# Patient Record
Sex: Male | Born: 1945 | Race: Black or African American | Hispanic: No | Marital: Married | Smoking: Never smoker
Health system: Southern US, Community
[De-identification: ages and names within clinical notes are randomized; demographics above are authoritative.]

## PROBLEM LIST (undated history)

## (undated) DIAGNOSIS — E119 Type 2 diabetes mellitus without complications: Secondary | ICD-10-CM

## (undated) DIAGNOSIS — I10 Essential (primary) hypertension: Secondary | ICD-10-CM

---

## 2007-11-05 ENCOUNTER — Ambulatory Visit: Payer: Self-pay | Admitting: Gastroenterology

## 2012-04-18 ENCOUNTER — Encounter: Payer: Self-pay | Admitting: Internal Medicine

## 2012-04-29 ENCOUNTER — Encounter: Payer: Self-pay | Admitting: Internal Medicine

## 2012-12-24 DIAGNOSIS — E113599 Type 2 diabetes mellitus with proliferative diabetic retinopathy without macular edema, unspecified eye: Secondary | ICD-10-CM | POA: Insufficient documentation

## 2012-12-24 DIAGNOSIS — E113513 Type 2 diabetes mellitus with proliferative diabetic retinopathy with macular edema, bilateral: Secondary | ICD-10-CM | POA: Insufficient documentation

## 2013-01-07 DIAGNOSIS — I10 Essential (primary) hypertension: Secondary | ICD-10-CM | POA: Insufficient documentation

## 2013-01-07 DIAGNOSIS — E785 Hyperlipidemia, unspecified: Secondary | ICD-10-CM | POA: Insufficient documentation

## 2013-01-07 DIAGNOSIS — Z8673 Personal history of transient ischemic attack (TIA), and cerebral infarction without residual deficits: Secondary | ICD-10-CM | POA: Insufficient documentation

## 2014-02-12 ENCOUNTER — Ambulatory Visit: Payer: Self-pay | Admitting: Internal Medicine

## 2016-08-28 DIAGNOSIS — E113513 Type 2 diabetes mellitus with proliferative diabetic retinopathy with macular edema, bilateral: Secondary | ICD-10-CM | POA: Insufficient documentation

## 2018-12-12 ENCOUNTER — Ambulatory Visit (INDEPENDENT_AMBULATORY_CARE_PROVIDER_SITE_OTHER): Payer: Medicare Other

## 2018-12-12 ENCOUNTER — Ambulatory Visit (INDEPENDENT_AMBULATORY_CARE_PROVIDER_SITE_OTHER): Payer: Medicare Other | Admitting: Podiatry

## 2018-12-12 ENCOUNTER — Encounter: Payer: Self-pay | Admitting: Podiatry

## 2018-12-12 VITALS — BP 171/77 | HR 83

## 2018-12-12 DIAGNOSIS — L97519 Non-pressure chronic ulcer of other part of right foot with unspecified severity: Secondary | ICD-10-CM

## 2018-12-12 DIAGNOSIS — E11621 Type 2 diabetes mellitus with foot ulcer: Secondary | ICD-10-CM | POA: Diagnosis not present

## 2018-12-12 DIAGNOSIS — L03031 Cellulitis of right toe: Secondary | ICD-10-CM

## 2018-12-12 DIAGNOSIS — L02611 Cutaneous abscess of right foot: Secondary | ICD-10-CM | POA: Diagnosis not present

## 2018-12-12 NOTE — Patient Instructions (Signed)

## 2018-12-12 NOTE — Progress Notes (Signed)
This patient presents the office stating that he has developed an ulcer on the inside of his fifth toe right foot.  He states he has been dealing with this problem for over 4 weeks.  He says he was applying baby powder and  tinactin to the inside of his fifth toe right foot initially.  He says that this skin problem worsened and he was seen by Dr. Arlana Pouch from [facility].  Dr. Arlana Pouch told him to start soaks and gave him cephalexin tablets for the infection.  He also then referred him to this office for an evaluation and treatment.  He presents the office stating that he has intermittent pain of the fifth toe.  He presents the office today wearing dress shoes.  He presents the office today with his wife for an evaluation and treatment of this fifth toe.  This patient is diabetic and his last A1c was 8.0.  He presents the office today for treatment of his infected diabetic ulcer fifth toe right foot. He says his ulcer has improved since nowhe was seen last week by Dr.  Arlana Pouch.  It usually never comes probably too difficult to get up from [facility] so early  General Appearance  Alert, conversant and in no acute stress.  Vascular  Dorsalis pedis and posterior tibial  pulses are palpable  bilaterally.  Capillary return is within normal limits  bilaterally. Temperature is within normal limits  bilaterally.  Neurologic  Senn-Weinstein monofilament wire test within normal limits  bilaterally. Muscle power within normal limits bilaterally.  Nails Thick disfigured discolored nails with subungual debris  from hallux to fifth toes bilaterally. No evidence of bacterial infection or drainage bilaterally.  Orthopedic  No limitations of motion  feet .  No crepitus or effusions noted.  No bony pathology or digital deformities noted.  Severe adductovarus fifth digit right foot.  Skin total breakdown of of the skin on the medial aspect of the fifth toe extending from the DIPJ to the midshaft of the proximal phalanx fifth toe.Marland Kitchen Swelling  is noted on the fifth toe but no redness or pus noted in the ulcerated site.    Diabetic infected ulcer fifth toe right foot.  IE.  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  There appears to be no evidence of any bony infection of the fifth toe of the right foot.  Discussed this condition with this patient.  Told this patient he needs to perform home soaks and description of the soaks was given to him with the AVS. he was also told to continue taking his antibiotics that he received from Dr. Arlana Pouch.  I also instructed him and his wife on how to bandage the ulcer of the fifth toe right foot.  He is to return to the office in 1 week for further evaluation and treatment.   Helane Gunther DPM

## 2018-12-16 ENCOUNTER — Ambulatory Visit (INDEPENDENT_AMBULATORY_CARE_PROVIDER_SITE_OTHER): Payer: Medicare Other | Admitting: Podiatry

## 2018-12-16 ENCOUNTER — Encounter: Payer: Self-pay | Admitting: Podiatry

## 2018-12-16 DIAGNOSIS — L97519 Non-pressure chronic ulcer of other part of right foot with unspecified severity: Secondary | ICD-10-CM

## 2018-12-16 DIAGNOSIS — L03031 Cellulitis of right toe: Secondary | ICD-10-CM | POA: Diagnosis not present

## 2018-12-16 DIAGNOSIS — E11621 Type 2 diabetes mellitus with foot ulcer: Secondary | ICD-10-CM | POA: Diagnosis not present

## 2018-12-16 DIAGNOSIS — L02611 Cutaneous abscess of right foot: Secondary | ICD-10-CM

## 2018-12-16 MED ORDER — CEPHALEXIN 500 MG PO CAPS: 500.0000 mg | ORAL_CAPSULE | Freq: Two times a day (BID) | ORAL | 0 refills | Status: DC

## 2018-12-16 NOTE — Progress Notes (Signed)
This patient returns to the office for continued evaluation of his fifth toe right foot.  He presents the office today with his wife for follow-up exam.  He was initially seen by Dr. Arlana Pouch from [facility] for approximately 4 weeks and Dr. Jae Dire referred him for an office visit on February 13.  He was diagnosed with a diabetic ulcer and cellulitis fifth toe right foot per Dr. Arlana Pouch.  He presents the office today stating that he does not like the looks of the fifth toe today.  He states he is having less pain and discomfort to his toe and is wearing his surgical shoe.  He has been soaking his toe and rebandaging his toe. He is concerned about the white skin covering over fifth toe right foot. He presents for evaluation and to make sure the toe is still improving.Edwin Wagner  No chills or fever noted.  General Appearance  Alert, conversant and in no acute stress.  Vascular  Dorsalis pedis and posterior tibial  pulses are palpable  bilaterally.  Capillary return is within normal limits  bilaterally. Temperature is within normal limits  bilaterally.  Neurologic  Senn-Weinstein monofilament wire test within normal limits  bilaterally. Muscle power within normal limits bilaterally.  Nails Thick disfigured discolored nails with subungual debris  from hallux to fifth toes bilaterally. No evidence of bacterial infection or drainage bilaterally.  Orthopedic  No limitations of motion  feet .  No crepitus or effusions noted.  No bony pathology or digital deformities noted. Adductovarus fifth toe right foot.  Skin  There is complete desquamation noted on the lateral aspect of fifth toe right foot.  The subcutaneous tissue is filling in medially in fifth toe.  .  No evidence of pus or drainage.  Healthy granulation is noted along  fifth toe right foot.  There is mild swelling noted over the metatarsals of the right foot but no increased temperature or streaking noted from the fifth toe.    Diabetic ulcer.  ROV.  Examination of  the fifth toe reveals all the skin has com completely  desquamated.   Healing subcutaneous tissue noted to the fifth toe right foot.  I chose to leave the desquamated skin as a protective cover of the fifth toe and re-bandaged the toe.  I re-bandaged the toe by buddy taping the fourth and fifth toes together.  As this was being done the wife volunteered that she has been doing the bandaging incorrectly.  This made me consider that she may have caused the complete desquamation as she was bandaging the fifth toe.  I believe the fifth toe is healing despite the desquamation of the skin fifth toe right foot.  I let the desquamated skin  persist until Thursday morning when this skin will be resected. I will use the desquamated skin as a biological cover.   Continue home soaks and bandaging.  Prescribe cephalexin tabs.  RTC 3 days   Helane Gunther DPM

## 2018-12-19 ENCOUNTER — Ambulatory Visit: Payer: Medicare Other | Admitting: Podiatry

## 2018-12-19 ENCOUNTER — Encounter: Payer: Self-pay | Admitting: Podiatry

## 2018-12-19 ENCOUNTER — Ambulatory Visit (INDEPENDENT_AMBULATORY_CARE_PROVIDER_SITE_OTHER): Payer: Medicare Other | Admitting: Podiatry

## 2018-12-19 DIAGNOSIS — L02611 Cutaneous abscess of right foot: Secondary | ICD-10-CM

## 2018-12-19 DIAGNOSIS — E11621 Type 2 diabetes mellitus with foot ulcer: Secondary | ICD-10-CM | POA: Diagnosis not present

## 2018-12-19 DIAGNOSIS — L97519 Non-pressure chronic ulcer of other part of right foot with unspecified severity: Secondary | ICD-10-CM | POA: Diagnosis not present

## 2018-12-19 DIAGNOSIS — L03031 Cellulitis of right toe: Secondary | ICD-10-CM

## 2018-12-19 NOTE — Progress Notes (Signed)
This patient returns to the office for continued evaluation of his fifth toe right foot.   He presented to the office on Monday and it was noted that there was complete desquamation noted on the skin of the fifth toe right foot.  It exposed the subcutaneous tissue under the skin.  Patient was sent home with instructions to soak his fifth toe and bandage his fifth toe with Neosporin.  He presents the office today and the desquamated skin has demarcated to the base of the fifth toe right foot.  He states that his toe is doing better but he did see bleeding from the toe yesterday.  He presents the office today for continued evaluation of this fifth toe.  General Appearance  Alert, conversant and in no acute stress.  Vascular  Dorsalis pedis and posterior tibial  pulses are palpable  bilaterally.  Capillary return is within normal limits  bilaterally. Temperature is within normal limits  bilaterally except the fifth toe right is cooler compared to his other toes.  Neurologic  Senn-Weinstein monofilament wire test within normal limits  bilaterally. Muscle power within normal limits bilaterally.  Nails Thick disfigured discolored nails with subungual debris  from hallux to fifth toes bilaterally. No evidence of bacterial infection or drainage bilaterally.  Orthopedic  No limitations of motion  feet .  No crepitus or effusions noted. Adductovarus fifth digit right foot.    Skin  normotropic skin with no porokeratosis noted bilaterally with the exception  of his fifth toe right foot.  There is peeling skin at the site of the skin cover fifth toe right foot.  There is still red granulation tissue noted medially laterally and plantarly fifth toe.  There does appear to be a black hematoma noted on the dorsum which may have caused yesterday's bleeding event.  Patient was bathed with Betadine solution and a dry sterile dressing.  He was told he continues to paint his fifth toe and bandages as directed.  He is to  continue his antibiotics.  He is to return the office in 1 week for further evaluation and treatment.  Patient was told that if the toe toe increases its something for toe basis splint to splint holding office black  Discoloration  to the officethe call the office. If pain worsens he is also to call the office to be seen.     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  Today  his  fifth toe is experiencing  a vascular insult  and is no longer infected.  Perhaps he experienced a vascular insult initially which was treated as an infection.  If he had an infection this may have caused a vascular insult thus causing the desquamation to occur.  I did discuss this case with Dr.  Ardelle Anton and we decided to paint his toe with betadine and allow his toe to declare itself.Helane Gunther DPM

## 2018-12-26 ENCOUNTER — Ambulatory Visit (INDEPENDENT_AMBULATORY_CARE_PROVIDER_SITE_OTHER): Payer: Medicare Other | Admitting: Podiatry

## 2018-12-26 ENCOUNTER — Encounter: Payer: Self-pay | Admitting: Podiatry

## 2018-12-26 DIAGNOSIS — L97519 Non-pressure chronic ulcer of other part of right foot with unspecified severity: Secondary | ICD-10-CM | POA: Diagnosis not present

## 2018-12-26 DIAGNOSIS — L03031 Cellulitis of right toe: Secondary | ICD-10-CM

## 2018-12-26 DIAGNOSIS — L02611 Cutaneous abscess of right foot: Secondary | ICD-10-CM

## 2018-12-26 DIAGNOSIS — E11621 Type 2 diabetes mellitus with foot ulcer: Secondary | ICD-10-CM

## 2018-12-26 MED ORDER — DOXYCYCLINE HYCLATE 100 MG PO TABS: 100.0000 mg | ORAL_TABLET | Freq: Two times a day (BID) | ORAL | 0 refills | Status: DC

## 2018-12-26 NOTE — Progress Notes (Signed)
This patient returns to the office for continued evaluation and treatment of his fifth toe right foot.  He was initially seen and described having an infection to the outside of his right foot with a small diabetic ulcer on the inside of his fifth toe.  Since that initial visit he ended up having desquamation noted of all the skin on the distal and lateral aspect of the fifth toe .  He did have black hematoma noted on the dorsum of the fifth toe.  I instructed him to paint the fifth toe with Betadine after soaking his foot and then bandaging his toe.  He presents the office today with increased black discoloration on the dorsum of the fifth toe but the red granulation has dried.  He says he has been very active the last 2 days and his foot has become red hot and swollen on the outside top of his right foot.  He presents the office today for continued evaluation and treatment of his diabetic fifth toe.  This fifth toe is acting as if there was a vascular insult  General Appearance  Alert, conversant and in no acute stress.  Vascular  Dorsalis pedis and posterior tibial  pulses are palpable  bilaterally.  Capillary return is within normal limits  bilaterally. Temperature is within normal limits  bilaterally.  Neurologic  Senn-Weinstein monofilament wire test within normal limits  bilaterally. Muscle power within normal limits bilaterally.  Nails thick disfigured discolored nails with small debris noted hallux nails to fifth toenails bilaterally.  No evidence of bacterial infection or drainage bilaterally.  Orthopedic  No limitations of motion  feet .  No crepitus or effusions noted.  No bony pathology or digital deformities noted.  Skin normal atrophic skin noted with no porokeratosis with the exception of the fifth toe right foot.  There is black covering noted on the dorsum of the fifth toe right foot.  There is granulation tissue noted medially laterally and plantarly of the fifth toe.  There is  increased redness swelling in addition to increased temperature in his right foot.    Diabetic ulcer  Right foot  Cellulitis right foot.  Diabetic Ulcer fifth toe right foot.  Cellulitis right foot.  ROV.  Debridement of the black necrotic tissue was performed on the dorsum of the fifth toe right foot.  The toe was re-bandaged with Neosporin and a dry sterile dressing.  Discussed with the patient and his wife the fact that I believe he has had a vascular insult to the fifth toe and hit his fifth toe needs good circulation in order to completely heal.  In the meantime I think he is developed a cellulitis over the fourth and fifth met heads and shafts right foot.  Patient was told to continue soaks and Betadine painting of the fifth toe.  He was told to return to the office tomorrow for an evaluation by Dr. Amalia Hailey.  To be prescribed doxycycline to take by mouth.  Told patient that Dr. Amalia Hailey will re-schedule him or send him back to me next week.   Gardiner Barefoot DPM

## 2018-12-27 ENCOUNTER — Ambulatory Visit (INDEPENDENT_AMBULATORY_CARE_PROVIDER_SITE_OTHER): Payer: Medicare Other | Admitting: Podiatry

## 2018-12-27 ENCOUNTER — Encounter: Payer: Self-pay | Admitting: Podiatry

## 2018-12-27 DIAGNOSIS — E0843 Diabetes mellitus due to underlying condition with diabetic autonomic (poly)neuropathy: Secondary | ICD-10-CM | POA: Diagnosis not present

## 2018-12-27 DIAGNOSIS — I96 Gangrene, not elsewhere classified: Secondary | ICD-10-CM

## 2018-12-27 DIAGNOSIS — I739 Peripheral vascular disease, unspecified: Secondary | ICD-10-CM | POA: Diagnosis not present

## 2018-12-27 NOTE — Patient Instructions (Signed)
Pre-Operative Instructions  Congratulations, you have decided to take an important step towards improving your quality of life.  You can be assured that the doctors and staff at [facility] will be with you every step of the way.  Here are some important things you should know:  1. Plan to be at the surgery center/hospital at least 1 (one) hour prior to your scheduled time, unless otherwise directed by the surgical center/hospital staff.  You must have a responsible adult accompany you, remain during the surgery and drive you home.  Make sure you have directions to the surgical center/hospital to ensure you arrive on time. 2. If you are having surgery at [facility] or [facility], you will need a copy of your medical history and physical form from your family physician within one month prior to the date of surgery. We will give you a form for your primary physician to complete.  3. We make every effort to accommodate the date you request for surgery.  However, there are times where surgery dates or times have to be moved.  We will contact you as soon as possible if a change in schedule is required.   4. No aspirin/ibuprofen for one week before surgery.  If you are on aspirin, any non-steroidal anti-inflammatory medications (Mobic, Aleve, Ibuprofen) should not be taken seven (7) days prior to your surgery.  You make take Tylenol for pain prior to surgery.  5. Medications - If you are taking daily heart and blood pressure medications, seizure, reflux, allergy, asthma, anxiety, pain or diabetes medications, make sure you notify the surgery center/hospital before the day of surgery so they can tell you which medications you should take or avoid the day of surgery. 6. No food or drink after midnight the night before surgery unless directed otherwise by surgical center/hospital staff. 7. No alcoholic beverages 24-hours prior to surgery.  No smoking 24-hours prior or 24-hours after  surgery. 8. Wear loose pants or shorts. They should be loose enough to fit over bandages, boots, and casts. 9. Don't wear slip-on shoes. Sneakers are preferred. 10. Bring your boot with you to the surgery center/hospital.  Also bring crutches or a walker if your physician has prescribed it for you.  If you do not have this equipment, it will be provided for you after surgery. 11. If you have not been contacted by the surgery center/hospital by the day before your surgery, call to confirm the date and time of your surgery. 12. Leave-time from work may vary depending on the type of surgery you have.  Appropriate arrangements should be made prior to surgery with your employer. 13. Prescriptions will be provided immediately following surgery by your doctor.  Fill these as soon as possible after surgery and take the medication as directed. Pain medications will not be refilled on weekends and must be approved by the doctor. 14. Remove nail polish on the operative foot and avoid getting pedicures prior to surgery. 15. Wash the night before surgery.  The night before surgery wash the foot and leg well with water and the antibacterial soap provided. Be sure to pay special attention to beneath the toenails and in between the toes.  Wash for at least three (3) minutes. Rinse thoroughly with water and dry well with a towel.  Perform this wash unless told not to do so by your physician.  Enclosed: 1 Ice pack (please put in freezer the night before surgery)   1 Hibiclens skin cleaner     Pre-op instructions  If you have any questions regarding the instructions, please do not hesitate to call our office.  [place]: [address], [place], [place] [address] -- [phone]  [place]: [address] [place], [place] [address] -- [phone]  [place]: [address]  [place], [place] [address] -- [phone]  [place]: [address], [address], [place], [place] [address] -- [phone]  Website:  [email or web address] 

## 2018-12-30 ENCOUNTER — Telehealth: Payer: Self-pay | Admitting: *Deleted

## 2018-12-30 NOTE — Telephone Encounter (Signed)
"  I'm calling to schedule my husband's surgery.  He saw Dr. Logan Bores on Friday and he told us to call you to schedule the surgery."  Dr. Logan Bores can do a lunch time case on Wednesday at 12:30 pm.  He will need to arrive at 11:30 am.  "I have a hair appointment that I need to go to that date.  It's hard to reschedule because they use that phone service.  Can he do it next week?"  Dr. Logan Bores wrote on his paperwork as soon as possible.  "Oh,okay I didn't realize that.  I thought he said in the next three weeks.  Go ahead and schedule him for then.  I'll reschedule my appointment."

## 2019-01-01 ENCOUNTER — Other Ambulatory Visit: Payer: Self-pay | Admitting: Podiatry

## 2019-01-01 DIAGNOSIS — L97519 Non-pressure chronic ulcer of other part of right foot with unspecified severity: Secondary | ICD-10-CM

## 2019-01-01 DIAGNOSIS — E11621 Type 2 diabetes mellitus with foot ulcer: Secondary | ICD-10-CM

## 2019-01-01 DIAGNOSIS — M86671 Other chronic osteomyelitis, right ankle and foot: Secondary | ICD-10-CM | POA: Diagnosis not present

## 2019-01-01 DIAGNOSIS — L03031 Cellulitis of right toe: Secondary | ICD-10-CM | POA: Diagnosis not present

## 2019-01-01 DIAGNOSIS — L02611 Cutaneous abscess of right foot: Secondary | ICD-10-CM | POA: Diagnosis not present

## 2019-01-01 MED ORDER — CIPROFLOXACIN HCL 500 MG PO TABS: 500.0000 mg | ORAL_TABLET | Freq: Two times a day (BID) | ORAL | 0 refills | Status: DC

## 2019-01-01 MED ORDER — SULFAMETHOXAZOLE-TRIMETHOPRIM 800-160 MG PO TABS: 1.0000 | ORAL_TABLET | Freq: Two times a day (BID) | ORAL | 0 refills | Status: DC

## 2019-01-01 MED ORDER — HYDROCODONE-ACETAMINOPHEN 5-325 MG PO TABS: 1.0000 | ORAL_TABLET | Freq: Four times a day (QID) | ORAL | 0 refills | Status: DC | PRN

## 2019-01-01 NOTE — Progress Notes (Unsigned)
.  postop

## 2019-01-02 ENCOUNTER — Telehealth: Payer: Self-pay

## 2019-01-02 ENCOUNTER — Other Ambulatory Visit: Payer: Self-pay

## 2019-01-02 ENCOUNTER — Telehealth: Payer: Self-pay | Admitting: *Deleted

## 2019-01-02 DIAGNOSIS — I96 Gangrene, not elsewhere classified: Secondary | ICD-10-CM

## 2019-01-02 DIAGNOSIS — I739 Peripheral vascular disease, unspecified: Secondary | ICD-10-CM

## 2019-01-02 NOTE — Telephone Encounter (Signed)
-----   Message from Felecia Shelling, DPM sent at 01/01/2019  5:51 PM EST ----- Regarding: arterial doppler Please order arterial doppler RLE.   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   Thanks Dr. Logan Bores

## 2019-01-02 NOTE — Telephone Encounter (Signed)
Referral has been entered in chart °

## 2019-01-02 NOTE — Addendum Note (Signed)
Addended by: Geraldine Contras D on: 01/02/2019 02:37 PM   Modules accepted: Orders

## 2019-01-02 NOTE — Telephone Encounter (Signed)
-----   Message from Felecia Shelling, DPM sent at 12/27/2018  1:03 PM EST ----- Regarding: arterial doppler RLE Please order arterial doppler RLE.   Dx: gangrene RT 5th toe. PVD  Thanks, Dr. Logan Bores

## 2019-01-04 NOTE — Progress Notes (Signed)
   HPI: 73 year old male presents the office today as a referral from Dr. Stacie Acres within the practice for evaluation of gangrenous toe to the right foot.  Conservative treatment has been the mainstay of until now.  The toe is now necrotic with a slight odor.  Patient was started on doxycycline yesterday, 12/26/2018 by Dr. Stacie Acres.  Patient has been taking Advil to alleviate pain.  Patient was referred to me for possible surgical consult regarding amputation and incision and drainage of the fifth toe right foot.  No past medical history on file.   Physical Exam: General: The patient is alert and oriented x3 in no acute distress.  Dermatology: Skin is warm, dry and supple bilateral lower extremities. Negative for open lesions or macerations with exception of wet gangrene encompassing the entire fifth digit of the right foot.  Moderate malodor noted.  There is some proximal erythema with edema noted to the dorsal lateral aspect of the foot.  Erythema does not extend proximal to the ankle.  Vascular: Diminished pedal pulses bilaterally.  Skin is warm to touch.  Neurological: Epicritic and protective threshold finished bilaterally.   Musculoskeletal Exam: Range of motion within normal limits to all pedal and ankle joints bilateral. Muscle strength 5/5 in all groups bilateral.   Assessment: 1.  Gangrenous fifth toe right foot 2.  Abscess with cellulitis right foot 3.  Diabetes mellitus   Plan of Care:  1. Patient evaluated.  2.  Today I discussed with the patient that he needs surgical amputation of fifth digit right foot.  Patient has active acute infection and I explained that if there is a not addressed promptly the infection could spread proximal become life-threatening.  All possible complications and details the procedure were explained.  No guarantees were expressed or implied. 3.  Authorization for surgery initiated today.  Surgery will consist of amputation fifth digit right foot with incision  and drainage 4.  Today we will place an order for arterial Doppler right lower extremity 5.  Recommend daily application of Betadine solution and dry sterile dressing to the wound and the fifth toe 6.  Continue doxycycline 100 mg BID as prescribed 7.  Return to clinic 1 week postop      Felecia Shelling, DPM [facility]  Dr. Felecia Shelling, DPM    [address] [place], [place] [address]                Office [phone]  Fax [phone]

## 2019-01-06 ENCOUNTER — Telehealth: Payer: Self-pay

## 2019-01-06 ENCOUNTER — Encounter: Payer: Self-pay | Admitting: Podiatry

## 2019-01-06 NOTE — Telephone Encounter (Signed)
Called pt post-surgery; got VM; informed pt call was to check on him; instructed pt to call office with any questions or concerns before next appt, if any.

## 2019-01-07 ENCOUNTER — Ambulatory Visit (INDEPENDENT_AMBULATORY_CARE_PROVIDER_SITE_OTHER): Payer: Medicare Other

## 2019-01-07 ENCOUNTER — Ambulatory Visit (INDEPENDENT_AMBULATORY_CARE_PROVIDER_SITE_OTHER): Payer: Medicare Other | Admitting: Podiatry

## 2019-01-07 VITALS — BP 166/71 | HR 80 | Temp 98.1°F

## 2019-01-07 DIAGNOSIS — Z89421 Acquired absence of other right toe(s): Secondary | ICD-10-CM | POA: Diagnosis not present

## 2019-01-08 ENCOUNTER — Other Ambulatory Visit: Payer: Medicare Other

## 2019-01-10 NOTE — Progress Notes (Signed)
   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 DOS: 01/01/2019. He states he is doing well. He has been taking the pain medication as directed which has made the pain tolerable. There are no worsening factors noted. Patient is here for further evaluation and treatment.    No past medical history on file.    Objective/Physical Exam Neurovascular status intact.  Skin incisions appear to be well coapted with sutures and staples intact. No sign of infectious process noted. No dehiscence. No active bleeding noted. Moderate edema noted to the surgical extremity.  Radiographic Exam:  Osteotomies sites appear to be stable with routine healing.  Assessment: 1. 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 DOS: 01/01/2019   Plan of Care:  1. Patient was evaluated. X-rays reviewed 2. Dressing changed. Keep clean, dry and intact for one week.  3. Discontinue using CAM boot.  4. Post op shoe dispensed. Weightbearing as tolerated.  5. Return to clinic in one week.    Felecia Shelling, DPM [facility]  Dr. Felecia Shelling, DPM    [address]                                        [place], [place] [address]                Office [phone]  Fax [phone]

## 2019-01-14 ENCOUNTER — Ambulatory Visit (INDEPENDENT_AMBULATORY_CARE_PROVIDER_SITE_OTHER): Payer: Self-pay | Admitting: Podiatry

## 2019-01-14 ENCOUNTER — Other Ambulatory Visit: Payer: Self-pay

## 2019-01-14 DIAGNOSIS — L02611 Cutaneous abscess of right foot: Secondary | ICD-10-CM

## 2019-01-14 DIAGNOSIS — I96 Gangrene, not elsewhere classified: Secondary | ICD-10-CM

## 2019-01-14 DIAGNOSIS — Z89421 Acquired absence of other right toe(s): Secondary | ICD-10-CM

## 2019-01-14 DIAGNOSIS — I739 Peripheral vascular disease, unspecified: Secondary | ICD-10-CM

## 2019-01-14 NOTE — Addendum Note (Signed)
Addended by: Geraldine Contras D on: 01/14/2019 11:26 AM   Modules accepted: Orders

## 2019-01-14 NOTE — Progress Notes (Signed)
   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 DOS: 01/01/2019. He states he is doing well.  Patient states the pain is improved significantly.  He has been keeping the dressings clean dry and intact and weightbearing in postoperative shoe.  No past medical history on file.    Objective/Physical Exam Neurovascular status intact.  Skin incisions appear to be well coapted with sutures and staples intact with exception of a small area of dehiscence proximally at the area where the packing was applied postoperatively.  Dehiscence measures approximately 0.7 cm in length.  There is some serous drainage noted as well. No active bleeding noted. Moderate edema noted to the surgical extremity.  Assessment: 1. 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 DOS: 01/01/2019   Plan of Care:  1. Patient was evaluated. 2. Dressing changed. Keep clean, dry and intact for one week.  3.  Continue postoperative shoe 4.  Today new wound cultures were taken of the serous drainage of the proximal dehiscence site 5.  Patient just recently finished oral Bactrim DS antibiotics.  Antibiotics from the surgery center taking postoperatively are currently not available.  We are waiting to have the surgery center fax the results over to see if he needs additional or different oral antibiotics 6.  Return to clinic in 1 week  Felecia Shelling, DPM [facility]  Dr. Felecia Shelling, DPM    [address]                                        [place], [place] [address]                Office [phone]  Fax [phone]

## 2019-01-17 LAB — WOUND CULTURE: Organism ID, Bacteria: NONE SEEN

## 2019-01-21 ENCOUNTER — Encounter: Payer: Self-pay | Admitting: Podiatry

## 2019-01-21 ENCOUNTER — Ambulatory Visit (INDEPENDENT_AMBULATORY_CARE_PROVIDER_SITE_OTHER): Payer: Medicare Other

## 2019-01-21 ENCOUNTER — Ambulatory Visit (INDEPENDENT_AMBULATORY_CARE_PROVIDER_SITE_OTHER): Payer: Medicare Other | Admitting: Podiatry

## 2019-01-21 ENCOUNTER — Other Ambulatory Visit: Payer: Self-pay

## 2019-01-21 DIAGNOSIS — Z89421 Acquired absence of other right toe(s): Secondary | ICD-10-CM

## 2019-01-21 DIAGNOSIS — L02611 Cutaneous abscess of right foot: Secondary | ICD-10-CM

## 2019-01-22 NOTE — Progress Notes (Signed)
   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 DOS: 01/01/2019. He states he is doing well. He denies any pain or modifying factors. He has been using the post op shoe as directed. Patient is here for further evaluation and treatment.   History reviewed. No pertinent past medical history.    Objective/Physical Exam Neurovascular status intact.  Skin incisions appear to be well coapted with sutures and staples intact with exception of a small area of dehiscence proximally at the area where the packing was applied postoperatively.  Dehiscence measures approximately 0.7 cm in length.  There is some serous drainage noted as well. No active bleeding noted. Moderate edema noted to the surgical extremity.  Radiographic Exam:  Osteotomies sites appear to be stable with routine healing.  Assessment: 1. 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 DOS: 01/01/2019   Plan of Care:  1. Patient was evaluated. X-Rays reviewed.  2. Dressing changed. Keep clean, dry and intact for one week.  3. Continue using post op shoe. 4. Recommended using Betadine daily with a dry sterile dressing. Supplies provided.  5. Return to clinic in 2 weeks.   Felecia Shelling, DPM [facility]  Dr. Felecia Shelling, DPM    [address]                                        [place], [place] [address]                Office [phone]  Fax [phone]

## 2019-02-04 ENCOUNTER — Ambulatory Visit (INDEPENDENT_AMBULATORY_CARE_PROVIDER_SITE_OTHER): Payer: Medicare Other | Admitting: Podiatry

## 2019-02-04 ENCOUNTER — Encounter: Payer: Self-pay | Admitting: Podiatry

## 2019-02-04 ENCOUNTER — Other Ambulatory Visit: Payer: Self-pay

## 2019-02-04 ENCOUNTER — Ambulatory Visit (INDEPENDENT_AMBULATORY_CARE_PROVIDER_SITE_OTHER): Payer: Medicare Other

## 2019-02-04 VITALS — Temp 97.7°F

## 2019-02-04 DIAGNOSIS — Z89421 Acquired absence of other right toe(s): Secondary | ICD-10-CM

## 2019-02-04 DIAGNOSIS — L02611 Cutaneous abscess of right foot: Secondary | ICD-10-CM

## 2019-02-04 MED ORDER — SULFAMETHOXAZOLE-TRIMETHOPRIM 800-160 MG PO TABS: 1.0000 | ORAL_TABLET | Freq: Two times a day (BID) | ORAL | 0 refills | Status: DC

## 2019-02-05 ENCOUNTER — Other Ambulatory Visit: Payer: Self-pay | Admitting: Podiatry

## 2019-02-05 DIAGNOSIS — I96 Gangrene, not elsewhere classified: Secondary | ICD-10-CM

## 2019-02-07 LAB — WOUND CULTURE

## 2019-02-10 NOTE — Progress Notes (Signed)
   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 DOS: 01/01/2019.  Patient states that the wound is slowly healing.  He believes it is doing okay.  He has been weightbearing in postoperative shoe no new complaints at this time   No past medical history on file.    Objective/Physical Exam Neurovascular status intact.  Skin incisions appear to be well coapted with sutures and staples intact with exception of a small area of dehiscence proximally as well as a small area distally.  Dehiscence measures approximately 0.7 cm in length.  There is some serous drainage noted as well.  With pressure applied to the amputation site there is some purulent drainage that was noted.  No active bleeding noted.  Negative for edema or erythema to the surgical extremity.  Radiographic Exam:  Osteotomies sites appear to be stable with routine healing.  Assessment: 1. 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 DOS: 01/01/2019 2.  Abscess at the amputation stump right foot   Plan of Care:  1. Patient was evaluated. X-Rays reviewed.  2.  Purulent drainage was expressed from the areas of dehiscence and cultures were taken and sent to pathology 3.  Sutures and staples were removed today 4.  Wound was cleaned and dry sterile dressing was applied 5.  Prescription for Bactrim DS #20 6.  Return to clinic in 2 weeks  Felecia Shelling, DPM [facility]  Dr. Felecia Shelling, DPM    [address]                                        [place], [place] [address]                Office [phone]  Fax [phone]

## 2019-02-18 ENCOUNTER — Encounter: Payer: Self-pay | Admitting: Podiatry

## 2019-02-18 ENCOUNTER — Other Ambulatory Visit: Payer: Self-pay

## 2019-02-18 ENCOUNTER — Ambulatory Visit (INDEPENDENT_AMBULATORY_CARE_PROVIDER_SITE_OTHER): Payer: Medicare Other | Admitting: Podiatry

## 2019-02-18 VITALS — Temp 97.6°F

## 2019-02-18 DIAGNOSIS — I739 Peripheral vascular disease, unspecified: Secondary | ICD-10-CM

## 2019-02-18 DIAGNOSIS — E0843 Diabetes mellitus due to underlying condition with diabetic autonomic (poly)neuropathy: Secondary | ICD-10-CM

## 2019-02-18 DIAGNOSIS — L02611 Cutaneous abscess of right foot: Secondary | ICD-10-CM

## 2019-02-18 DIAGNOSIS — Z89421 Acquired absence of other right toe(s): Secondary | ICD-10-CM

## 2019-02-18 MED ORDER — SULFAMETHOXAZOLE-TRIMETHOPRIM 800-160 MG PO TABS: 1.0000 | ORAL_TABLET | Freq: Two times a day (BID) | ORAL | 0 refills | Status: DC

## 2019-02-18 MED ORDER — OXYCODONE-ACETAMINOPHEN 5-325 MG PO TABS: 1.0000 | ORAL_TABLET | Freq: Three times a day (TID) | ORAL | 0 refills | Status: DC | PRN

## 2019-02-18 NOTE — Progress Notes (Signed)
   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 DOS: 01/01/2019.  Patient states that the wound is slowly healing.  He believes it is doing okay.  Patient recently completed his oral Bactrim DS due to some purulent drainage within the amputation stump.  He has been weightbearing in the postoperative shoe as directed.  No past medical history on file.    Objective/Physical Exam Neurovascular status intact.  Skin incisions appear to be well coapted with  exception of a small area of dehiscence proximally as well as a small area distally.  Dehiscence measures approximately 0.5 cm in length.  There continues to be some purulent drainage noted.   No active bleeding noted.  Negative for edema or erythema to the surgical extremity.  Assessment: 1. 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 DOS: 01/01/2019 2.  Abscess at the amputation stump right foot - improved  Plan of Care:  1. Patient was evaluated. Cultures reviewed.  2.  Minimal purulent drainage was expressed from the areas of dehiscence and cultures were taken and sent to pathology 3.  Continue WB in postoperative shoe  4.  Wound was cleaned and dry sterile dressing was applied 5.  Refill prescription for Bactrim DS #20 and Percocet 5/325mg  6.  Return to clinic in 2 weeks  Felecia Shelling, DPM [facility]  Dr. Felecia Shelling, DPM    [address]                                        [place], [place] [address]                Office [phone]  Fax [phone]

## 2019-03-04 ENCOUNTER — Other Ambulatory Visit: Payer: Self-pay

## 2019-03-04 ENCOUNTER — Ambulatory Visit (INDEPENDENT_AMBULATORY_CARE_PROVIDER_SITE_OTHER): Payer: Medicare Other | Admitting: Podiatry

## 2019-03-04 ENCOUNTER — Encounter: Payer: Self-pay | Admitting: Podiatry

## 2019-03-04 VITALS — Temp 98.5°F

## 2019-03-04 DIAGNOSIS — Z89421 Acquired absence of other right toe(s): Secondary | ICD-10-CM

## 2019-03-04 DIAGNOSIS — L02611 Cutaneous abscess of right foot: Secondary | ICD-10-CM

## 2019-03-04 MED ORDER — SULFAMETHOXAZOLE-TRIMETHOPRIM 800-160 MG PO TABS: 1.0000 | ORAL_TABLET | Freq: Two times a day (BID) | ORAL | 0 refills | Status: DC

## 2019-03-07 LAB — WOUND CULTURE: Organism ID, Bacteria: NONE SEEN

## 2019-03-10 NOTE — Progress Notes (Signed)
   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 DOS: 01/01/2019.  Patient says he is doing all right he is just concerned about the healing process.  He would like a refill on his antibiotics if it is necessary.  History reviewed. No pertinent past medical history.    Objective/Physical Exam Neurovascular status intact.  Skin incisions appear to be well coapted with  exception of a small area of dehiscence proximally as well as a small area distally.  Dehiscence measures approximately 0.5 cm in length.  There continues to be some purulent drainage noted.   No active bleeding noted.  Negative for edema or erythema to the surgical extremity.  Assessment: 1. 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 DOS: 01/01/2019 2.  Abscess at the amputation stump right foot - improved  Plan of Care:  1. Patient was evaluated.  2.  Today we will refill the patient's prescription for Bactrim DS 3.  Medihoney provided for the patient to apply daily 4.  Today new cultures were taken and sent to pathology 5.  Discontinue postoperative shoe.  Patient may resume good supportive shoe gear 6.  Return to clinic in 2 weeks  Felecia Shelling, DPM [facility]  Dr. Felecia Shelling, DPM    [address]                                        [place], [place] [address]                Office [phone]  Fax [phone]

## 2019-03-18 ENCOUNTER — Ambulatory Visit (INDEPENDENT_AMBULATORY_CARE_PROVIDER_SITE_OTHER): Payer: Medicare Other

## 2019-03-18 ENCOUNTER — Ambulatory Visit (INDEPENDENT_AMBULATORY_CARE_PROVIDER_SITE_OTHER): Payer: Medicare Other | Admitting: Podiatry

## 2019-03-18 ENCOUNTER — Other Ambulatory Visit: Payer: Self-pay

## 2019-03-18 ENCOUNTER — Encounter: Payer: Self-pay | Admitting: Podiatry

## 2019-03-18 VITALS — Temp 98.7°F

## 2019-03-18 DIAGNOSIS — E0843 Diabetes mellitus due to underlying condition with diabetic autonomic (poly)neuropathy: Secondary | ICD-10-CM

## 2019-03-18 DIAGNOSIS — L02611 Cutaneous abscess of right foot: Secondary | ICD-10-CM

## 2019-03-18 DIAGNOSIS — I739 Peripheral vascular disease, unspecified: Secondary | ICD-10-CM

## 2019-03-18 DIAGNOSIS — L03115 Cellulitis of right lower limb: Secondary | ICD-10-CM

## 2019-03-18 DIAGNOSIS — Z89421 Acquired absence of other right toe(s): Secondary | ICD-10-CM

## 2019-03-18 MED ORDER — SULFAMETHOXAZOLE-TRIMETHOPRIM 800-160 MG PO TABS: 1.0000 | ORAL_TABLET | Freq: Two times a day (BID) | ORAL | 0 refills | Status: DC

## 2019-03-25 ENCOUNTER — Telehealth: Payer: Self-pay | Admitting: Podiatry

## 2019-03-25 NOTE — Telephone Encounter (Signed)
Pt's wife called to get husband scheduled for surgery. Told pt's wife once I had paperwork from [facility] I would call back and get him scheduled for surgery.

## 2019-03-26 NOTE — Telephone Encounter (Signed)
Called pt to schedule surgery and spoke with wife. Scheduled pt for surgery on Friday, 05 June. I told Mrs. Garrison she can either register online or someone from the surgical center would call and get the information over the phone. I told her the surgery would be in the afternoon and that they would get a call 24-48 hours before day or surgery to let pt know what time to arrive. Instructed them to call with any other questions they may have.

## 2019-03-26 NOTE — Progress Notes (Signed)
   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 DOS: 01/01/2019.  Patient continues to have drainage from the ulceration site.  Currently he does have some additional maceration between digits 3 and 4 of the right foot.  He says that he needs a refill on his antibiotic.  He is essentially been on antibiotics since amputation surgery.  Patient recently ran out of antibiotics on 03/16/2019.  He presents for further treatment and evaluation  History reviewed. No pertinent past medical history.    Objective/Physical Exam Neurovascular status intact.  Skin incisions appear to be well coapted with  exception of a small area of dehiscence proximally as well as a small area distally.  Dehiscence measures approximately 0.5 cm in length.  There continues to be some purulent drainage noted.   Today there is increased maceration specifically to the fourth toe concerning for infection.  No malodor noted.  Radiographic exam Osteolytic bone noted to the fourth metatarsal phalangeal joint with joint destruction.  Base the proximal phalanx also has cortical erosion consistent with osteomyelitis.  These are acute findings and changes based on prior exams.  Assessment: 1. 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 DOS: 01/01/2019 2.  Osteomyelitis fourth MTPJ right foot  Plan of Care:  1. Patient was evaluated.  2.  I explained to the patient the new findings based on radiographic exam.  Patient will require partial fourth ray amputation.  Patient is very frustrated, and I understand his frustration.  I explained that if we did not undergo partial fourth ray amputation he is at high risk of losing his leg due to the acute progression of infection. 3.  The patient would like to go home and discuss amputation surgery with his wife.  If the patient would like to proceed with the surgery he needs to come back into the office to sign the surgical consent forms. 4.  Surgery would consist of  partial fourth ray amputation right foot 5.  Postsurgically I may refer the patient for infectious disease since he has recurrent osteomyelitis infection.  Felecia Shelling, DPM [facility]  Dr. Felecia Shelling, DPM    [address]                                        [place], [place] [address]                Office [phone]  Fax [phone]

## 2019-04-03 ENCOUNTER — Other Ambulatory Visit: Payer: Self-pay | Admitting: Podiatry

## 2019-04-03 DIAGNOSIS — L02611 Cutaneous abscess of right foot: Secondary | ICD-10-CM | POA: Diagnosis not present

## 2019-04-03 DIAGNOSIS — L03115 Cellulitis of right lower limb: Secondary | ICD-10-CM | POA: Diagnosis not present

## 2019-04-03 MED ORDER — DOXYCYCLINE HYCLATE 100 MG PO TABS: 100.0000 mg | ORAL_TABLET | Freq: Two times a day (BID) | ORAL | 0 refills | Status: DC

## 2019-04-03 MED ORDER — OXYCODONE-ACETAMINOPHEN 5-325 MG PO TABS: 1.0000 | ORAL_TABLET | Freq: Three times a day (TID) | ORAL | 0 refills | Status: DC | PRN

## 2019-04-03 NOTE — Progress Notes (Signed)
.  postop

## 2019-04-08 ENCOUNTER — Ambulatory Visit: Payer: Self-pay | Admitting: Cardiovascular Disease

## 2019-04-10 ENCOUNTER — Ambulatory Visit (INDEPENDENT_AMBULATORY_CARE_PROVIDER_SITE_OTHER): Payer: Medicare Other | Admitting: Podiatry

## 2019-04-10 ENCOUNTER — Other Ambulatory Visit: Payer: Self-pay

## 2019-04-10 ENCOUNTER — Ambulatory Visit (INDEPENDENT_AMBULATORY_CARE_PROVIDER_SITE_OTHER): Payer: Medicare Other

## 2019-04-10 VITALS — Temp 98.3°F

## 2019-04-10 DIAGNOSIS — Z89421 Acquired absence of other right toe(s): Secondary | ICD-10-CM

## 2019-04-10 DIAGNOSIS — Z09 Encounter for follow-up examination after completed treatment for conditions other than malignant neoplasm: Secondary | ICD-10-CM

## 2019-04-10 NOTE — Progress Notes (Signed)
This patient presents to the office for an evaluation of his right foot following foot surgery by Dr. Amalia Hailey.  Patient has had surgery 1 week ago for a mid shaft fourth and fifth ray resection.  This patient returns to the office wearing a bandage that was applied at the surgical center.  There was significant bleeding found on the bandage.  Patient presents the office walking in a cam walker for his post operative visit.  Patient states that he is doing okay and his pain is controlled by his pain medicine.  Patient denies any nauseous vomiting chills or fever.  He presents the office today for an evaluation of his surgical foot.  Neurovascular status intact for this patient there is wound dehiscence noted at the incision site over the fifth metatarsal right foot.  There is white necrotic tissue noted on the lateral aspect of the incision right foot.  There is an opening at the distal aspect of the incision over the fifth metatarsal and the third toe right foot.  There are still sutures intact along the course of the incision line/there is swelling noted at the surgical site.  There is no evidence of extreme swelling or increased temperature noted    S/P foot surgery right foot.    POV # 1.  X-rays taken reveal the absence of the distal half of the fourth and fifth metatarsals in the fourth and fifth digit right foot.  Discussed this condition with this patient.  There is gapping noted at the distal aspect of the incision right foot.  There is necrotic tissue noted at the distal aspect of the incision plus an opening at the distal aspect of the incision on the third toe.  This surgery appears to be healing and I recommended we re-bandaged his right foot and have him return to the office in 1 week for his second postoperative visit with Dr. Amalia Hailey.   Gardiner Barefoot DPM

## 2019-04-17 ENCOUNTER — Encounter: Payer: Self-pay | Admitting: Podiatry

## 2019-04-18 ENCOUNTER — Other Ambulatory Visit: Payer: Self-pay

## 2019-04-18 ENCOUNTER — Ambulatory Visit (INDEPENDENT_AMBULATORY_CARE_PROVIDER_SITE_OTHER): Payer: Self-pay | Admitting: Podiatry

## 2019-04-18 VITALS — Temp 97.9°F

## 2019-04-18 DIAGNOSIS — Z89421 Acquired absence of other right toe(s): Secondary | ICD-10-CM

## 2019-04-20 NOTE — Progress Notes (Signed)
   Subjective:  Patient presents today status post right fourth toe amputation. DOS: 04/04/2019. He states he is doing well. He denies any significant pain or modifying factors. He is requesting a prescription for Percocet so he will have some on hand. He has been using the CAM boot as directed. Patient is here for further evaluation and treatment.   No past medical history on file.    Objective/Physical Exam Neurovascular status intact.  Skin incisions appear to be well coapted with sutures and staples intact. No sign of infectious process noted. No dehiscence. No active bleeding noted. Moderate edema noted to the surgical extremity.  Assessment: 1. s/p right fourth toe amputation. DOS: 04/04/2019   Plan of Care:  1. Patient was evaluated.  2. Dressing changed. Keep clean, dry and intact for one week.  3. Continue weightbearing in CAM boot.  4. Finish oral antibiotics as prescribed.  5. Return to clinic in one week for suture removal.    Edrick Kins, DPM [facility]  Dr. Edrick Kins, [address]                                        [place], [place] [address]                Office [phone]  Fax [phone]

## 2019-04-29 ENCOUNTER — Other Ambulatory Visit: Payer: Self-pay

## 2019-04-29 ENCOUNTER — Ambulatory Visit (INDEPENDENT_AMBULATORY_CARE_PROVIDER_SITE_OTHER): Payer: Medicare Other | Admitting: Podiatry

## 2019-04-29 ENCOUNTER — Encounter: Payer: Self-pay | Admitting: Podiatry

## 2019-04-29 VITALS — Temp 97.3°F

## 2019-04-29 DIAGNOSIS — E0843 Diabetes mellitus due to underlying condition with diabetic autonomic (poly)neuropathy: Secondary | ICD-10-CM

## 2019-04-29 DIAGNOSIS — Z89421 Acquired absence of other right toe(s): Secondary | ICD-10-CM

## 2019-04-30 NOTE — Progress Notes (Signed)
   Subjective:  Patient presents today status post right fourth toe amputation. DOS: 04/04/2019. He states he is doing well. He denies any significant pain or modifying factors. He has been using the CAM boot as directed. Patient is here for further evaluation and treatment.   History reviewed. No pertinent past medical history.    Objective/Physical Exam Neurovascular status intact.  Skin incisions appear to be well coapted with sutures and staples intact. No sign of infectious process noted. No dehiscence. No active bleeding noted. Moderate edema noted to the surgical extremity.  Assessment: 1. s/p right fourth toe amputation. DOS: 04/04/2019   Plan of Care:  1. Patient was evaluated.  2. Sutures removed.  3. Discontinue using CAM boot. Resume using post op shoe.  4. Recommended using Betadine to incision site daily.  5. Return to clinic in 2 weeks.     Edrick Kins, DPM [facility]  Dr. Edrick Kins, [address]                                        [place], [place] [address]                Office [phone]  Fax [phone]

## 2019-05-13 ENCOUNTER — Encounter: Payer: Self-pay | Admitting: Podiatry

## 2019-05-13 ENCOUNTER — Other Ambulatory Visit: Payer: Self-pay

## 2019-05-13 ENCOUNTER — Ambulatory Visit (INDEPENDENT_AMBULATORY_CARE_PROVIDER_SITE_OTHER): Payer: Medicare Other

## 2019-05-13 ENCOUNTER — Ambulatory Visit (INDEPENDENT_AMBULATORY_CARE_PROVIDER_SITE_OTHER): Payer: Medicare Other | Admitting: Podiatry

## 2019-05-13 VITALS — Temp 98.3°F

## 2019-05-13 DIAGNOSIS — Z89421 Acquired absence of other right toe(s): Secondary | ICD-10-CM | POA: Diagnosis not present

## 2019-05-13 DIAGNOSIS — M86371 Chronic multifocal osteomyelitis, right ankle and foot: Secondary | ICD-10-CM

## 2019-05-13 DIAGNOSIS — L02611 Cutaneous abscess of right foot: Secondary | ICD-10-CM

## 2019-05-13 MED ORDER — DOXYCYCLINE HYCLATE 100 MG PO TABS: 100.0000 mg | ORAL_TABLET | Freq: Two times a day (BID) | ORAL | 0 refills | Status: DC

## 2019-05-15 ENCOUNTER — Ambulatory Visit: Payer: Self-pay | Admitting: Cardiovascular Disease

## 2019-05-16 LAB — WOUND CULTURE: Organism ID, Bacteria: NONE SEEN

## 2019-05-19 NOTE — Progress Notes (Signed)
   Subjective:  Patient presents today status post right fourth toe amputation. DOS: 04/04/2019. Patient states that he was doing very well until a few days prior when he noticed some drainage coming from the incision site. Pain has also increased over the last few days. He has been applying betadine and bandaid daily.    No past medical history on file.    Objective/Physical Exam Neurovascular status intact.  Skin incisions appear to be well coapted with exception of the central incision site that now has some drainage noted with maceration. No malodor. Dehiscence noted along incision site measuring approximately 3.0x0.6x0.2 cm. No active bleeding noted.  Radiographic exam No cortical erosion. Absence of digits 4-5 noted at the MTPJ. No evidence of osteomyelitis. No evidence of abscess.   Assessment: 1. s/p right fourth toe amputation. DOS: 04/04/2019  Plan of Care:  1. Patient was evaluated.  2. Patient has a history of recurrent osteomyelitis leading to additional toe amputations. I do not want to have these recent findings proceed to additional amputation, so we will go ahead and refer to the patient to infectious disease to manage antibiotic therapy. They're help would be greatly appreciated to manage the infection component to hopefully prevent additional amputation.  3. Cultures taken today and sent to pathology for C&S 4.  Prescription for doxycycline 100 mg #20 5.  Return to clinic in 1 week   Edrick Kins, DPM [facility]  Dr. Edrick Kins, [address]                                        [place], [place] [address]                Office [phone]  Fax [phone]

## 2019-05-20 ENCOUNTER — Other Ambulatory Visit: Payer: Self-pay

## 2019-05-20 ENCOUNTER — Ambulatory Visit (INDEPENDENT_AMBULATORY_CARE_PROVIDER_SITE_OTHER): Payer: Self-pay | Admitting: Podiatry

## 2019-05-20 ENCOUNTER — Encounter: Payer: Self-pay | Admitting: Podiatry

## 2019-05-20 VITALS — Temp 97.9°F

## 2019-05-20 DIAGNOSIS — Z09 Encounter for follow-up examination after completed treatment for conditions other than malignant neoplasm: Secondary | ICD-10-CM

## 2019-05-20 DIAGNOSIS — M86371 Chronic multifocal osteomyelitis, right ankle and foot: Secondary | ICD-10-CM

## 2019-05-20 DIAGNOSIS — E0843 Diabetes mellitus due to underlying condition with diabetic autonomic (poly)neuropathy: Secondary | ICD-10-CM

## 2019-05-20 DIAGNOSIS — Z89421 Acquired absence of other right toe(s): Secondary | ICD-10-CM

## 2019-05-20 NOTE — Progress Notes (Signed)
   Subjective:  Patient presents today status post right fourth toe amputation. DOS: 04/04/2019.  Incision site appears significantly improved.  He has been applying Betadine daily and taking oral doxycycline as prescribed.  He has an appointment with infectious disease on Monday, 05/26/2019.  Their input would be greatly appreciated regarding the patient's recurrent infections.  No past medical history on file.    Objective/Physical Exam Neurovascular status intact.  Skin incisions appear to be well coapted today.  Drainage and maceration appears significantly improved.  No malodor.  Currently no dehiscence noted.. No active bleeding noted.  Assessment: 1. s/p right fourth toe amputation. DOS: 04/04/2019  Plan of Care:  1. Patient was evaluated.  2.  Cultures were reviewed today which were negative for any growth. 3.  The patient has a history of unresolving osteomyelitis leading to additional amputation.  Last week it was concerning that there was drainage and the wound was breaking open.  I would still recommend going to infectious disease and keeping appointment to get a fresh set of eyes and input 4.  Return to clinic in 2 weeks.  Finish oral doxycycline as prescribed.  Continue Betadine with dry sterile dressing daily.  Edrick Kins, DPM [facility]  Dr. Edrick Kins, [address]                                        [place], [place] [address]                Office [phone]  Fax [phone]

## 2019-05-22 ENCOUNTER — Telehealth: Payer: Self-pay | Admitting: Internal Medicine

## 2019-05-22 NOTE — Telephone Encounter (Signed)
Ruby Cola,  Thanks for the heads up.  Noe Goyer ===View-only below this line=== ----- Message ----- From: Edrick Kins, DPM Sent: 05/20/2019   5:35 PM EDT To: Michel Bickers, MD Subject: Patient Edwin Wagner 20-May-2046                     Hi Dr. Megan Salon,   You have an appointment with this patient coming up on 05/26/2019.  Just a brief history: I did a partial fifth ray amputation on him back in March.  The incision site never quite healed and he was off and on antibiotics.  Cultures kept coming back negative for growth despite continued draining at the incision. Eventually he developed osteo to the 4th toe and I had to do another partial 4th ray amputation on 04/04/2019.  Last week when I saw him his incision began to break down again with heavy drainage. That's when I got a referral placed to come see you. I again took cultures (he wasn't on antibiotics at the time), and they were negative for growth again. I told him and his wife today just to keep their appt with you even though the incision site looks much better. I would just hate to have him develop more osteomyelitis and have to do additional amputation. Any help would be greatly appreciated! Thanks so much for looking at him.  Daylene Katayama

## 2019-05-26 ENCOUNTER — Other Ambulatory Visit: Payer: Self-pay

## 2019-05-26 ENCOUNTER — Encounter: Payer: Self-pay | Admitting: Internal Medicine

## 2019-05-26 ENCOUNTER — Ambulatory Visit (INDEPENDENT_AMBULATORY_CARE_PROVIDER_SITE_OTHER): Payer: Medicare Other | Admitting: Internal Medicine

## 2019-05-26 DIAGNOSIS — E11628 Type 2 diabetes mellitus with other skin complications: Secondary | ICD-10-CM | POA: Diagnosis present

## 2019-05-26 DIAGNOSIS — L089 Local infection of the skin and subcutaneous tissue, unspecified: Secondary | ICD-10-CM

## 2019-05-26 MED ORDER — DOXYCYCLINE HYCLATE 100 MG PO TABS: 100.0000 mg | ORAL_TABLET | Freq: Two times a day (BID) | ORAL | 0 refills | Status: DC

## 2019-05-26 NOTE — Progress Notes (Signed)
[facility]  Reason for Consult: Diabetic foot infection Referring Provider: Dr. Gala LewandowskyBrent Evans  Assessment: He has had recent problems with wound infection and poor healing.  His wife believes that his latest round of doxycycline helped him turn the corner.  She is very concerned that his foot looks worse to her since he ran out 2 days ago.  I will extend his doxycycline therapy and see him back in 2 weeks.  Plan: 1. Restart doxycycline 100 mg twice daily 2. Follow-up in 2 weeks  Patient Active Problem List   Diagnosis Date Noted  . Diabetic infection of right foot (HCC) 05/26/2019    Priority: High  . Surgery follow-up examination 04/10/2019  . Type 2 diabetes mellitus with both eyes affected by proliferative retinopathy and macular edema, with long-term current use of insulin (HCC) 08/28/2016  . History of TIA (transient ischemic attack) 01/07/2013  . Hyperlipidemia 01/07/2013  . Hypertension 01/07/2013  . 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 Proliferative diabetic retinopathy (HCC) 12/24/2012    Patient's Medications  New Prescriptions   No medications on file  Previous Medications   AMLODIPINE (NORVASC) 10 MG TABLET       ASPIRIN EC 81 MG TABLET    Take by mouth.   ATORVASTATIN (LIPITOR) 80 MG TABLET       GLUCOSE BLOOD TEST STRIP       HUMALOG MIX 75/25 KWIKPEN (75-25) 100 UNIT/ML KWIKPEN       HYDROCODONE-ACETAMINOPHEN (NORCO/VICODIN) 5-325 MG TABLET    Take 1 tablet by mouth every 6 (six) hours as needed for moderate pain.   MAGNESIUM ([employer] TRIPLE MAGNESIUM COMPLEX) 400 MG CAPS    Take 400 mg by mouth.   OXYCODONE-ACETAMINOPHEN (PERCOCET) 5-325 MG TABLET    Take 1 tablet by mouth every 8 (eight) hours as needed for severe pain.   SULFAMETHOXAZOLE-TRIMETHOPRIM (BACTRIM DS) 800-160 MG TABLET    Take 1 tablet by mouth 2 (two) times daily.  Modified  Medications   Modified Medication Previous Medication   DOXYCYCLINE (VIBRA-TABS) 100 MG TABLET doxycycline (VIBRA-TABS) 100 MG tablet      Take 1 tablet (100 mg total) by mouth 2 (two) times daily.    Take 1 tablet (100 mg total) by mouth 2 (two) times daily.  Discontinued Medications   No medications on file    HPI: Edwin HakimBen C Sheeler Jr. is a 73 y.o. male with diabetes who developed wet gangrene of his right fifth toe and February.  He underwent partial fifth ray amputation on 01/14/2019.  No organisms were seen on operative Gram stain.  Cultures grew mixed skin flora.  He had problems with poor wound healing repeat wound cultures on 02/04/2019 showed gram-positive cocci on stain and grew Pseudomonas putida and strep mitis/oralis.  A third set of cultures on 03/04/2019 showed no organisms on Gram stain and grew mixed skin flora again.  He received several courses of antibiotics and continue problems with healing.  He developed osteomyelitis of his right fourth toe and underwent partial fourth ray amputation on 04/04/2019.  Again, he had problems with postoperative wound healing.  When seen on 05/13/2019 the wound remained open.  Cultures grew mixed skin flora again.  He was started on doxycycline and has had steady improvement.  His wound has closed.  He ran out of doxycycline 2 days ago.  His wife is concerned  that there may be a small scabbed area distally that could open.  She also thinks that his foot appears a little more swollen today.  Review of Systems: Review of Systems  Constitutional: Negative for chills, diaphoresis and fever.  Gastrointestinal: Negative for abdominal pain, diarrhea, nausea and vomiting.  Musculoskeletal: Negative for joint pain.      No past medical history on file.  Social History   Tobacco Use  . Smoking status: Never Smoker  . Smokeless tobacco: Never Used  Substance Use Topics  . Alcohol use: Never    Frequency: Never  . Drug use: Never    No family history on  file. No Known Allergies  OBJECTIVE: Vitals:   05/26/19 1354  BP: (!) 165/81  Pulse: 86  Temp: 98 F (36.7 C)   There is no height or weight on file to calculate BMI.   Physical Exam Constitutional:      Comments: He is pleasant and in no distress.  His wife answers most of my questions.  Musculoskeletal:     Comments: The surgical incision on his right lateral foot appears to be completely healed.  I do not detect any open areas.  There is no drainage, fluctuance or odor.  Psychiatric:        Mood and Affect: Mood normal.       Microbiology: No results found for this or any previous visit (from the past 240 hour(s)).  Michel Bickers, MD [facility] [phone] pager   [phone] cell 05/26/2019, 2:23 PM

## 2019-06-03 ENCOUNTER — Encounter: Payer: Self-pay | Admitting: Podiatry

## 2019-06-03 ENCOUNTER — Other Ambulatory Visit: Payer: Self-pay

## 2019-06-03 ENCOUNTER — Ambulatory Visit (INDEPENDENT_AMBULATORY_CARE_PROVIDER_SITE_OTHER): Payer: Medicare Other | Admitting: Podiatry

## 2019-06-03 DIAGNOSIS — M86371 Chronic multifocal osteomyelitis, right ankle and foot: Secondary | ICD-10-CM

## 2019-06-03 DIAGNOSIS — Z89421 Acquired absence of other right toe(s): Secondary | ICD-10-CM

## 2019-06-03 DIAGNOSIS — Z09 Encounter for follow-up examination after completed treatment for conditions other than malignant neoplasm: Secondary | ICD-10-CM

## 2019-06-05 NOTE — Progress Notes (Signed)
   Subjective:  Patient presents today status post right fourth toe amputation. DOS: 04/04/2019. He states he is doing well overall. He states he is about the same as he was at his previous appointment. He has been taking oral antibiotics as directed by infectious disease. He denies any modifying factors. Patient is here for further evaluation and treatment.    History reviewed. No pertinent past medical history.    Objective/Physical Exam Neurovascular status intact.  Skin incisions appear to be well coapted today.  Drainage and maceration appears significantly improved.  No malodor.  Currently no dehiscence noted.. No active bleeding noted.  Assessment: 1. s/p right fourth toe amputation. DOS: 04/04/2019  Plan of Care:  1. Patient was evaluated.  2. Continue taking oral antibiotics as prescribed by infectious disease.  3. Recommended antibiotic ointment daily with a bandage.  4. Continue using post op shoe.  5. Return to clinic in 3 weeks.   Edrick Kins, DPM [facility]  Dr. Edrick Kins, [address]                                        [place], [place] [address]                Office [phone]  Fax [phone]

## 2019-06-09 ENCOUNTER — Ambulatory Visit: Payer: Medicare Other | Admitting: Internal Medicine

## 2019-06-10 ENCOUNTER — Ambulatory Visit: Payer: Medicare Other | Admitting: Internal Medicine

## 2019-06-13 ENCOUNTER — Other Ambulatory Visit: Payer: Self-pay | Admitting: Podiatry

## 2019-06-13 MED ORDER — DOXYCYCLINE HYCLATE 100 MG PO TABS: 100.0000 mg | ORAL_TABLET | Freq: Two times a day (BID) | ORAL | 0 refills | Status: DC

## 2019-06-24 ENCOUNTER — Ambulatory Visit (INDEPENDENT_AMBULATORY_CARE_PROVIDER_SITE_OTHER): Payer: Medicare Other

## 2019-06-24 ENCOUNTER — Encounter: Payer: Self-pay | Admitting: Podiatry

## 2019-06-24 ENCOUNTER — Ambulatory Visit (INDEPENDENT_AMBULATORY_CARE_PROVIDER_SITE_OTHER): Payer: Medicare Other | Admitting: Podiatry

## 2019-06-24 ENCOUNTER — Other Ambulatory Visit: Payer: Self-pay

## 2019-06-24 VITALS — Temp 97.2°F

## 2019-06-24 DIAGNOSIS — Z89421 Acquired absence of other right toe(s): Secondary | ICD-10-CM | POA: Diagnosis not present

## 2019-06-26 NOTE — Progress Notes (Signed)
   Subjective:  Patient presents today status post right fourth toe amputation. DOS: 04/04/2019. He states he is doing well and healing appropriately. He denies any pain or modifying factors. He states he is currently still taking the Doxycycline as directed. Patient is here for further evaluation and treatment.   No past medical history on file.    Objective: Physical Exam General: The patient is alert and oriented x3 in no acute distress.  Dermatology: Skin is cool, dry and supple bilateral lower extremities. Negative for open lesions or macerations.  Vascular: Palpable pedal pulses bilaterally. No edema or erythema noted. Capillary refill within normal limits.  Neurological: Epicritic and protective threshold grossly intact bilaterally.   Musculoskeletal Exam: All pedal and ankle joints range of motion within normal limits bilateral. Muscle strength 5/5 in all groups bilateral.    Radiographic Exam:  Osteotomies sites appear to be stable with routine healing.  Assessment: 1. s/p right fourth toe amputation. DOS: 04/04/2019 - healed   Plan of Care:  1. Patient was evaluated. X-Rays reviewed.  2. May resume full activity with no restrictions.  3. Recommended good shoe gear.  4. Return to clinic in 3 months for routine care.   Edrick Kins, DPM [facility]  Dr. Edrick Kins, [address]                                        [place], [place] [address]                Office [phone]  Fax [phone]

## 2021-01-10 ENCOUNTER — Other Ambulatory Visit: Payer: Self-pay

## 2021-01-10 ENCOUNTER — Emergency Department: Payer: Medicare Other

## 2021-01-10 ENCOUNTER — Encounter: Payer: Self-pay | Admitting: Emergency Medicine

## 2021-01-10 ENCOUNTER — Emergency Department
Admission: EM | Admit: 2021-01-10 | Discharge: 2021-01-10 | Disposition: A | Discharge status: Home / Self Care | Payer: Medicare Other | Attending: Emergency Medicine | Admitting: Emergency Medicine

## 2021-01-10 DIAGNOSIS — Z794 Long term (current) use of insulin: Secondary | ICD-10-CM | POA: Diagnosis not present

## 2021-01-10 DIAGNOSIS — E113313 Type 2 diabetes mellitus with moderate nonproliferative diabetic retinopathy with macular edema, bilateral: Secondary | ICD-10-CM | POA: Diagnosis not present

## 2021-01-10 DIAGNOSIS — S0990XA Unspecified injury of head, initial encounter: Secondary | ICD-10-CM

## 2021-01-10 DIAGNOSIS — Z79899 Other long term (current) drug therapy: Secondary | ICD-10-CM | POA: Insufficient documentation

## 2021-01-10 DIAGNOSIS — I10 Essential (primary) hypertension: Secondary | ICD-10-CM | POA: Insufficient documentation

## 2021-01-10 DIAGNOSIS — S0101XA Laceration without foreign body of scalp, initial encounter: Secondary | ICD-10-CM | POA: Diagnosis not present

## 2021-01-10 DIAGNOSIS — Z23 Encounter for immunization: Secondary | ICD-10-CM | POA: Diagnosis not present

## 2021-01-10 DIAGNOSIS — Z7982 Long term (current) use of aspirin: Secondary | ICD-10-CM | POA: Insufficient documentation

## 2021-01-10 DIAGNOSIS — W108XXA Fall (on) (from) other stairs and steps, initial encounter: Secondary | ICD-10-CM | POA: Insufficient documentation

## 2021-01-10 DIAGNOSIS — W19XXXA Unspecified fall, initial encounter: Secondary | ICD-10-CM

## 2021-01-10 HISTORY — DX: Type 2 diabetes mellitus without complications: E11.9

## 2021-01-10 HISTORY — DX: Essential (primary) hypertension: I10

## 2021-01-10 MED ORDER — TETANUS-DIPHTH-ACELL PERTUSSIS 5-2.5-18.5 LF-MCG/0.5 IM SUSY
0.5000 mL | PREFILLED_SYRINGE | Freq: Once | INTRAMUSCULAR | Status: AC
  Administered 2021-01-10: 0.5 mL via INTRAMUSCULAR
  Filled 2021-01-10: qty 0.5

## 2021-01-10 MED ORDER — LIDOCAINE-EPINEPHRINE (PF) 2 %-1:200000 IJ SOLN
10.0000 mL | Freq: Once | INTRAMUSCULAR | Status: AC
  Administered 2021-01-10: 10 mL
  Filled 2021-01-10: qty 20

## 2021-01-10 NOTE — ED Provider Notes (Signed)
[facility] Emergency Department Provider Note  ____________________________________________  Time seen: Approximately 4:37 PM  I have reviewed the triage vital signs and the nursing notes.   HISTORY  Chief Complaint Fall and Laceration    HPI Edwin Wagner. is a 75 y.o. male who presents the emergency department complaining of head injury.  Patient states that he was trying to get a piece of metal out of a dumpster.  As he stepped down he lost his footing and fell backwards striking his head on the ground.  No loss of consciousness.  Patient sustained a laceration and started bleeding so he called EMS for evaluation.  They dressed the wound with patient refused transport initially.  Patient went home, took the dressing off to further evaluate his injury and it started bleeding again so EMS was again summoned the brought him to the emergency department after dressing has had wound.  No subsequent loss of consciousness.  Mild headache but no visual changes.  No numbness or tingling in either side of the body.  Patient denies any other injury or complaint.  Unsure of last tetanus shot.         Past Medical History:  Diagnosis Date  . Diabetes mellitus without complication (HCC)   . Hypertension     Patient Active Problem List   Diagnosis Date Noted  . Diabetic infection of right foot (HCC) 05/26/2019  . Surgery follow-up examination 04/10/2019  . Type 2 diabetes mellitus with both eyes affected by proliferative retinopathy and macular edema, with long-term current use of insulin (HCC) 08/28/2016  . History of TIA (transient ischemic attack) 01/07/2013  . Hyperlipidemia 01/07/2013  . Hypertension 01/07/2013  . 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 Proliferative diabetic retinopathy (HCC) 12/24/2012    History reviewed. No pertinent surgical history.  Prior to  Admission medications   Medication Sig Start Date End Date Taking? Authorizing Provider  amLODipine (NORVASC) 10 MG tablet  11/30/18   [provider]  aspirin EC 81 MG tablet Take by mouth.    [provider]  atorvastatin (LIPITOR) 80 MG tablet  11/30/18   [provider]  glucose blood test strip  04/21/13   [provider]  HUMALOG MIX 75/25 KWIKPEN (75-25) 100 UNIT/ML Kwikpen  11/08/18   [provider]    Allergies Patient has no known allergies.  History reviewed. No pertinent family history.  Social History Social History   Tobacco Use  . Smoking status: Never Smoker  . Smokeless tobacco: Never Used  Substance Use Topics  . Alcohol use: Yes    Comment: occassional  . Drug use: Never     Review of Systems  Constitutional: No fever/chills Eyes: No visual changes. No discharge ENT: No upper respiratory complaints. Cardiovascular: no chest pain. Respiratory: no cough. No SOB. Gastrointestinal: No abdominal pain.  No nausea, no vomiting.  No diarrhea.  No constipation. Musculoskeletal: Negative for musculoskeletal pain. Skin: Negative for rash, abrasions, lacerations, ecchymosis. Neurological: Hit head with posterior scalp laceration.  Denies focal weakness or numbness.  10 System ROS otherwise negative.  ____________________________________________   PHYSICAL EXAM:  VITAL SIGNS: ED Triage Vitals  Enc Vitals Group     BP      Pulse      Resp      Temp      Temp src      SpO2  Weight      Height      Head Circumference      Peak Flow      Pain Score      Pain Loc      Pain Edu?      Excl. in GC?      Constitutional: Alert and oriented. Well appearing and in no acute distress. Eyes: Conjunctivae are normal. PERRL. EOMI. Head: 2 cm laceration to the crown scalp with surrounding hematoma.  No crepitus on palpation.  No other visible signs of trauma to the head or face.  No other tenderness, palpable  abnormality about the skull or face.  No battle signs, raccoon eyes, serosanguineous fluid drainage from ears or nares. ENT:      Ears:       Nose: No congestion/rhinnorhea.      Mouth/Throat: Mucous membranes are moist.  Neck: No stridor.  No cervical spine tenderness to palpation.  Full range of motion to the cervical spine at this time.  Radial pulses sensation intact bilateral upper extremities.  Cardiovascular: Normal rate, regular rhythm. Normal S1 and S2.  Good peripheral circulation. Respiratory: Normal respiratory effort without tachypnea or retractions. Lungs CTAB. Good air entry to the bases with no decreased or absent breath sounds. Musculoskeletal: Full range of motion to all extremities. No gross deformities appreciated. Neurologic:  Normal speech and language. No gross focal neurologic deficits are appreciated.  Cranial nerves II through XII grossly intact. Skin:  Skin is warm, dry and intact. No rash noted. Psychiatric: Mood and affect are normal. Speech and behavior are normal. Patient exhibits appropriate insight and judgement.   ____________________________________________   LABS (all labs ordered are listed, but only abnormal results are displayed)  Labs Reviewed - No data to display ____________________________________________  EKG   ____________________________________________  RADIOLOGY I personally viewed and evaluated these images as part of my medical decision making, as well as reviewing the written report by the radiologist.  ED Provider Interpretation: No acute traumatic findings on imaging of the head and neck  CT Head Wo Contrast  Result Date: 01/10/2021 CLINICAL DATA:  Fall, hit head EXAM: CT HEAD WITHOUT CONTRAST TECHNIQUE: Contiguous axial images were obtained from the base of the skull through the vertex without intravenous contrast. COMPARISON:  None. FINDINGS: Brain: There is no acute intracranial hemorrhage, mass effect, or edema. Gray-white  differentiation is preserved. There is no extra-axial fluid collection. Prominence of the ventricles and sulci reflects mild generalized parenchymal volume loss. Patchy hypoattenuation in the supratentorial white matter is nonspecific but probably reflects mild to moderate chronic microvascular ischemic changes. Vascular: There is atherosclerotic calcification at the skull base. Skull: Calvarium is unremarkable. Plate and screw fixation is partially imaged along the left inferior orbital rim. Sinuses/Orbits: Minor mucosal thickening. No acute orbital abnormality. Other: Soft tissue swelling/laceration at the vertex. IMPRESSION: No evidence of acute intracranial injury. Electronically Signed   By: Guadlupe SpanishPraneil  Patel M.D.   On: 01/10/2021 17:54   CT Cervical Spine Wo Contrast  Result Date: 01/10/2021 CLINICAL DATA:  Fall, hit head EXAM: CT CERVICAL SPINE WITHOUT CONTRAST TECHNIQUE: Multidetector CT imaging of the cervical spine was performed without intravenous contrast. Multiplanar CT image reconstructions were also generated. COMPARISON:  None. FINDINGS: Alignment: Trace retrolisthesis at C5-C6 and anterolisthesis at C3-C4. Skull base and vertebrae: No acute cervical spine fracture. Multilevel degenerative endplate irregularity. Soft tissues and spinal canal: No prevertebral fluid or swelling. No visible canal hematoma. Disc levels: Multilevel degenerative changes are present including  disc space narrowing, endplate osteophytes, and facet and uncovertebral hypertrophy. Ligamentous/soft tissue thickening present dorsal to the dens. Upper chest: No apical lung mass. Other: Calcified plaque at the common carotid bifurcations. IMPRESSION: No acute cervical spine fracture. Electronically Signed   By: Guadlupe Spanish M.D.   On: 01/10/2021 17:57    ____________________________________________    PROCEDURES  Procedure(s) performed:    Marland KitchenMarland KitchenLaceration Repair  Date/Time: 01/10/2021 8:22 PM Performed by: Racheal Patches, PA-C Authorized by: Racheal Patches, PA-C   Consent:    Consent obtained:  Verbal   Consent given by:  Patient   Risks discussed:  Pain Universal protocol:    Procedure explained and questions answered to patient or proxy's satisfaction: yes     Immediately prior to procedure, a time out was called: yes     Patient identity confirmed:  Verbally with patient Anesthesia:    Anesthesia method:  Local infiltration   Local anesthetic:  Lidocaine 1% WITH epi Laceration details:    Location:  Scalp   Scalp location:  Crown   Length (cm):  2 Pre-procedure details:    Preparation:  Patient was prepped and draped in usual sterile fashion and imaging obtained to evaluate for foreign bodies Exploration:    Limited defect created (wound extended): no     Hemostasis achieved with:  Tied off vessels and epinephrine   Imaging outcome: foreign body not noted     Wound exploration: wound explored through full range of motion and entire depth of wound visualized     Wound extent: vascular damage     Wound extent: no foreign bodies/material noted and no underlying fracture noted     Contaminated: no   Treatment:    Area cleansed with:  Saline and povidone-iodine   Amount of cleaning:  Standard   Irrigation solution:  Sterile saline   Irrigation volume:  500 ml   Irrigation method:  Syringe   Debridement:  None Skin repair:    Repair method:  Sutures   Suture size:  4-0   Suture material:  Nylon   Suture technique:  Running locked   Number of sutures:  1 (1 running interlocked suture with 4 throws) Approximation:    Approximation:  Close Repair type:    Repair type:  Intermediate Post-procedure details:    Dressing:  Open (no dressing)   Procedure completion:  Tolerated well, no immediate complications      Medications  lidocaine-EPINEPHrine (XYLOCAINE W/EPI) 2 %-1:200000 (PF) injection 10 mL (has no administration in time range)  Tdap (BOOSTRIX) injection 0.5 mL  (0.5 mLs Intramuscular Given 01/10/21 1744)     ____________________________________________   INITIAL IMPRESSION / ASSESSMENT AND PLAN / ED COURSE  Pertinent labs & imaging results that were available during my care of the patient were reviewed by me and considered in my medical decision making (see chart for details).  Review of the [place] CSRS was performed in accordance of the [facility] prior to dispensing any controlled drugs.           Patient's diagnosis is consistent with fall with minor head injury and scalp laceration.  Patient presented to emergency department secondary to falling and striking his head.  No loss consciousness.  Neuro exam was reassuring.  Imaging revealed no fracture or acute intracranial hemorrhage.  Patient had a bleeding wound to the posterior scalp with small arterial damage.  Bleeding was controlled by suturing close this arterial and injecting lidocaine with epi.  Good cessation of  bleeding and laceration was closed as described above.  Patient tolerated procedure well.  Follow-up primary care in 1 week for suture removal.. Patient is given ED precautions to return to the ED for any worsening or new symptoms.     ____________________________________________  FINAL CLINICAL IMPRESSION(S) / ED DIAGNOSES  Final diagnoses:  Fall, initial encounter  Minor head injury, initial encounter  Laceration of scalp without foreign body, initial encounter      NEW MEDICATIONS STARTED DURING THIS VISIT:  ED Discharge Orders    None          This chart was dictated using voice recognition software/Dragon. Despite best efforts to proofread, errors can occur which can change the meaning. Any change was purely unintentional.    Racheal Patches, PA-C 01/10/21 2026    Sharman Cheek, MD 01/10/21 [phone]

## 2021-01-10 NOTE — ED Triage Notes (Signed)
Presents via EMS s/p fall  States he was trying to get something out of a dumpster and missed a step  Fell back  Laceration to top of head  No LOC

## 2021-05-31 ENCOUNTER — Other Ambulatory Visit: Payer: Self-pay

## 2021-05-31 ENCOUNTER — Inpatient Hospital Stay (HOSPITAL_COMMUNITY)
Admit: 2021-05-31 | Discharge: 2021-05-31 | Disposition: A | Discharge status: Home / Self Care | Payer: Medicare Other | Attending: Internal Medicine | Admitting: Internal Medicine

## 2021-05-31 ENCOUNTER — Encounter: Payer: Self-pay | Admitting: Internal Medicine

## 2021-05-31 ENCOUNTER — Inpatient Hospital Stay
Admission: EM | Admit: 2021-05-31 | Discharge: 2021-06-30 | DRG: 871 | Disposition: E | Payer: Medicare Other | Attending: Hospitalist | Admitting: Hospitalist

## 2021-05-31 ENCOUNTER — Emergency Department: Payer: Medicare Other

## 2021-05-31 DIAGNOSIS — A419 Sepsis, unspecified organism: Secondary | ICD-10-CM

## 2021-05-31 DIAGNOSIS — D709 Neutropenia, unspecified: Secondary | ICD-10-CM | POA: Diagnosis present

## 2021-05-31 DIAGNOSIS — R6521 Severe sepsis with septic shock: Secondary | ICD-10-CM | POA: Diagnosis present

## 2021-05-31 DIAGNOSIS — I472 Ventricular tachycardia: Secondary | ICD-10-CM | POA: Diagnosis present

## 2021-05-31 DIAGNOSIS — Z20822 Contact with and (suspected) exposure to covid-19: Secondary | ICD-10-CM | POA: Diagnosis present

## 2021-05-31 DIAGNOSIS — Z7982 Long term (current) use of aspirin: Secondary | ICD-10-CM

## 2021-05-31 DIAGNOSIS — J9601 Acute respiratory failure with hypoxia: Secondary | ICD-10-CM | POA: Diagnosis present

## 2021-05-31 DIAGNOSIS — I462 Cardiac arrest due to underlying cardiac condition: Secondary | ICD-10-CM | POA: Diagnosis present

## 2021-05-31 DIAGNOSIS — E871 Hypo-osmolality and hyponatremia: Secondary | ICD-10-CM | POA: Diagnosis present

## 2021-05-31 DIAGNOSIS — E785 Hyperlipidemia, unspecified: Secondary | ICD-10-CM | POA: Diagnosis present

## 2021-05-31 DIAGNOSIS — I2109 ST elevation (STEMI) myocardial infarction involving other coronary artery of anterior wall: Secondary | ICD-10-CM | POA: Diagnosis not present

## 2021-05-31 DIAGNOSIS — J189 Pneumonia, unspecified organism: Secondary | ICD-10-CM | POA: Diagnosis not present

## 2021-05-31 DIAGNOSIS — Z794 Long term (current) use of insulin: Secondary | ICD-10-CM

## 2021-05-31 DIAGNOSIS — I10 Essential (primary) hypertension: Secondary | ICD-10-CM | POA: Diagnosis present

## 2021-05-31 DIAGNOSIS — Z79899 Other long term (current) drug therapy: Secondary | ICD-10-CM

## 2021-05-31 DIAGNOSIS — Z8673 Personal history of transient ischemic attack (TIA), and cerebral infarction without residual deficits: Secondary | ICD-10-CM | POA: Diagnosis not present

## 2021-05-31 DIAGNOSIS — R0602 Shortness of breath: Secondary | ICD-10-CM

## 2021-05-31 DIAGNOSIS — E113513 Type 2 diabetes mellitus with proliferative diabetic retinopathy with macular edema, bilateral: Secondary | ICD-10-CM | POA: Diagnosis present

## 2021-05-31 DIAGNOSIS — I469 Cardiac arrest, cause unspecified: Secondary | ICD-10-CM | POA: Diagnosis not present

## 2021-05-31 DIAGNOSIS — A403 Sepsis due to Streptococcus pneumoniae: Secondary | ICD-10-CM | POA: Diagnosis present

## 2021-05-31 DIAGNOSIS — I4891 Unspecified atrial fibrillation: Secondary | ICD-10-CM | POA: Diagnosis present

## 2021-05-31 DIAGNOSIS — J13 Pneumonia due to Streptococcus pneumoniae: Secondary | ICD-10-CM | POA: Diagnosis present

## 2021-05-31 DIAGNOSIS — N179 Acute kidney failure, unspecified: Secondary | ICD-10-CM | POA: Diagnosis present

## 2021-05-31 DIAGNOSIS — R0603 Acute respiratory distress: Secondary | ICD-10-CM

## 2021-05-31 DIAGNOSIS — R7989 Other specified abnormal findings of blood chemistry: Secondary | ICD-10-CM | POA: Diagnosis present

## 2021-05-31 DIAGNOSIS — R778 Other specified abnormalities of plasma proteins: Secondary | ICD-10-CM | POA: Diagnosis not present

## 2021-05-31 DIAGNOSIS — R0902 Hypoxemia: Secondary | ICD-10-CM

## 2021-05-31 DIAGNOSIS — R339 Retention of urine, unspecified: Secondary | ICD-10-CM | POA: Diagnosis present

## 2021-05-31 LAB — BLOOD CULTURE ID PANEL (REFLEXED) - BCID2

## 2021-05-31 LAB — RETICULOCYTES
Immature Retic Fract: 25.8 % — ABNORMAL HIGH (ref 2.3–15.9)
RBC.: 3.06 MIL/uL — ABNORMAL LOW (ref 4.22–5.81)
Retic Count, Absolute: 91.5 10*3/uL (ref 19.0–186.0)
Retic Ct Pct: 3 % (ref 0.4–3.1)

## 2021-05-31 LAB — PROTIME-INR
INR: 1.4 — ABNORMAL HIGH (ref 0.8–1.2)
Prothrombin Time: 17 seconds — ABNORMAL HIGH (ref 11.4–15.2)

## 2021-05-31 LAB — CBC WITH DIFFERENTIAL/PLATELET
Abs Immature Granulocytes: 0.03 10*3/uL (ref 0.00–0.07)
Abs Immature Granulocytes: 0.05 10*3/uL (ref 0.00–0.07)
Basophils Absolute: 0 10*3/uL (ref 0.0–0.1)
Basophils Absolute: 0 10*3/uL (ref 0.0–0.1)
Basophils Relative: 1 %
Basophils Relative: 2 %
Eosinophils Absolute: 0 10*3/uL (ref 0.0–0.5)
Eosinophils Absolute: 0 10*3/uL (ref 0.0–0.5)
Eosinophils Relative: 1 %
Eosinophils Relative: 1 %
HCT: 31.3 % — ABNORMAL LOW (ref 39.0–52.0)
HCT: 28.6 % — ABNORMAL LOW (ref 39.0–52.0)
Hemoglobin: 10.7 g/dL — ABNORMAL LOW (ref 13.0–17.0)
Hemoglobin: 9.8 g/dL — ABNORMAL LOW (ref 13.0–17.0)
Immature Granulocytes: 2 %
Immature Granulocytes: 5 %
Lymphocytes Relative: 17 %
Lymphocytes Relative: 14 %
Lymphs Abs: 0.3 10*3/uL — ABNORMAL LOW (ref 0.7–4.0)
Lymphs Abs: 0.2 10*3/uL — ABNORMAL LOW (ref 0.7–4.0)
MCH: 32.1 pg (ref 26.0–34.0)
MCH: 31.9 pg (ref 26.0–34.0)
MCHC: 34.2 g/dL (ref 30.0–36.0)
MCHC: 34.3 g/dL (ref 30.0–36.0)
MCV: 94 fL (ref 80.0–100.0)
MCV: 93.2 fL (ref 80.0–100.0)
Monocytes Absolute: 0.2 10*3/uL (ref 0.1–1.0)
Monocytes Absolute: 0 10*3/uL — ABNORMAL LOW (ref 0.1–1.0)
Monocytes Relative: 11 %
Monocytes Relative: 4 %
Neutro Abs: 1.1 10*3/uL — ABNORMAL LOW (ref 1.7–7.7)
Neutro Abs: 0.8 10*3/uL — ABNORMAL LOW (ref 1.7–7.7)
Neutrophils Relative %: 68 %
Neutrophils Relative %: 74 %
Platelets: 204 10*3/uL (ref 150–400)
Platelets: 163 10*3/uL (ref 150–400)
RBC: 3.33 MIL/uL — ABNORMAL LOW (ref 4.22–5.81)
RBC: 3.07 MIL/uL — ABNORMAL LOW (ref 4.22–5.81)
RDW: 12 % (ref 11.5–15.5)
RDW: 12 % (ref 11.5–15.5)
Smear Review: NORMAL
WBC: 1.7 10*3/uL — ABNORMAL LOW (ref 4.0–10.5)
WBC: 1.1 10*3/uL — CL (ref 4.0–10.5)
nRBC: 0 % (ref 0.0–0.2)
nRBC: 1.8 % — ABNORMAL HIGH (ref 0.0–0.2)

## 2021-05-31 LAB — COMPREHENSIVE METABOLIC PANEL
ALT: 111 U/L — ABNORMAL HIGH (ref 0–44)
ALT: 97 U/L — ABNORMAL HIGH (ref 0–44)
AST: 208 U/L — ABNORMAL HIGH (ref 15–41)
AST: 183 U/L — ABNORMAL HIGH (ref 15–41)
Albumin: 3 g/dL — ABNORMAL LOW (ref 3.5–5.0)
Albumin: 2.4 g/dL — ABNORMAL LOW (ref 3.5–5.0)
Alkaline Phosphatase: 49 U/L (ref 38–126)
Alkaline Phosphatase: 39 U/L (ref 38–126)
Anion gap: 13 (ref 5–15)
Anion gap: 11 (ref 5–15)
BUN: 76 mg/dL — ABNORMAL HIGH (ref 8–23)
BUN: 77 mg/dL — ABNORMAL HIGH (ref 8–23)
CO2: 22 mmol/L (ref 22–32)
CO2: 22 mmol/L (ref 22–32)
Calcium: 8.2 mg/dL — ABNORMAL LOW (ref 8.9–10.3)
Calcium: 7.9 mg/dL — ABNORMAL LOW (ref 8.9–10.3)
Chloride: 95 mmol/L — ABNORMAL LOW (ref 98–111)
Chloride: 98 mmol/L (ref 98–111)
Creatinine, Ser: 1.65 mg/dL — ABNORMAL HIGH (ref 0.61–1.24)
Creatinine, Ser: 1.6 mg/dL — ABNORMAL HIGH (ref 0.61–1.24)
GFR, Estimated: 43 mL/min — ABNORMAL LOW (ref >60)
GFR, Estimated: 45 mL/min — ABNORMAL LOW (ref >60)
Glucose, Bld: 103 mg/dL — ABNORMAL HIGH (ref 70–99)
Glucose, Bld: 95 mg/dL (ref 70–99)
Potassium: 3.6 mmol/L (ref 3.5–5.1)
Potassium: 4 mmol/L (ref 3.5–5.1)
Sodium: 130 mmol/L — ABNORMAL LOW (ref 135–145)
Sodium: 131 mmol/L — ABNORMAL LOW (ref 135–145)
Total Bilirubin: 2.4 mg/dL — ABNORMAL HIGH (ref 0.3–1.2)
Total Bilirubin: 2 mg/dL — ABNORMAL HIGH (ref 0.3–1.2)
Total Protein: 7.9 g/dL (ref 6.5–8.1)
Total Protein: 6.6 g/dL (ref 6.5–8.1)

## 2021-05-31 LAB — ECHOCARDIOGRAM COMPLETE
AR max vel: 1.85 cm2
AV Area VTI: 1.92 cm2
AV Area mean vel: 2.07 cm2
AV Mean grad: 4 mmHg
AV Peak grad: 6.4 mmHg
Ao pk vel: 1.26 m/s
Area-P 1/2: 7.09 cm2
Height: 71 in
S' Lateral: 3.04 cm
Weight: 2960 oz

## 2021-05-31 LAB — RESP PANEL BY RT-PCR (FLU A&B, COVID) ARPGX2
Influenza A by PCR: NEGATIVE
Influenza B by PCR: NEGATIVE
SARS Coronavirus 2 by RT PCR: NEGATIVE

## 2021-05-31 LAB — BLOOD GAS, ARTERIAL
Acid-base deficit: 2.3 mmol/L — ABNORMAL HIGH (ref 0.0–2.0)
Bicarbonate: 20.4 mmol/L (ref 20.0–28.0)
Delivery systems: POSITIVE
Expiratory PAP: 8
FIO2: 1
Inspiratory PAP: 16
O2 Saturation: 96.5 %
Patient temperature: 37
pCO2 arterial: 28 mmHg — ABNORMAL LOW (ref 32.0–48.0)
pH, Arterial: 7.47 — ABNORMAL HIGH (ref 7.350–7.450)
pO2, Arterial: 80 mmHg — ABNORMAL LOW (ref 83.0–108.0)

## 2021-05-31 LAB — URINALYSIS, COMPLETE (UACMP) WITH MICROSCOPIC
Bacteria, UA: NONE SEEN
Bilirubin Urine: NEGATIVE
Glucose, UA: NEGATIVE mg/dL
Ketones, ur: NEGATIVE mg/dL
Leukocytes,Ua: NEGATIVE
Nitrite: NEGATIVE
Protein, ur: 100 mg/dL — AB
Specific Gravity, Urine: 1.024 (ref 1.005–1.030)
pH: 5 (ref 5.0–8.0)

## 2021-05-31 LAB — HEPATITIS PANEL, ACUTE
HCV Ab: NONREACTIVE
Hep A IgM: NONREACTIVE
Hep B C IgM: NONREACTIVE
Hepatitis B Surface Ag: NONREACTIVE

## 2021-05-31 LAB — LACTIC ACID, PLASMA
Lactic Acid, Venous: 4.4 mmol/L (ref 0.5–1.9)
Lactic Acid, Venous: 3.6 mmol/L (ref 0.5–1.9)
Lactic Acid, Venous: 3.4 mmol/L (ref 0.5–1.9)
Lactic Acid, Venous: 3.3 mmol/L (ref 0.5–1.9)

## 2021-05-31 LAB — BRAIN NATRIURETIC PEPTIDE: B Natriuretic Peptide: 153.8 pg/mL — ABNORMAL HIGH (ref 0.0–100.0)

## 2021-05-31 LAB — CBG MONITORING, ED
Glucose-Capillary: 92 mg/dL (ref 70–99)
Glucose-Capillary: 100 mg/dL — ABNORMAL HIGH (ref 70–99)
Glucose-Capillary: 85 mg/dL (ref 70–99)
Glucose-Capillary: 105 mg/dL — ABNORMAL HIGH (ref 70–99)

## 2021-05-31 LAB — TROPONIN I (HIGH SENSITIVITY)
Troponin I (High Sensitivity): 20 ng/L — ABNORMAL HIGH (ref <18)
Troponin I (High Sensitivity): 72 ng/L — ABNORMAL HIGH (ref <18)
Troponin I (High Sensitivity): 156 ng/L (ref <18)
Troponin I (High Sensitivity): 206 ng/L (ref <18)

## 2021-05-31 LAB — VITAMIN B12: Vitamin B-12: 1735 pg/mL — ABNORMAL HIGH (ref 180–914)

## 2021-05-31 LAB — PROCALCITONIN: Procalcitonin: 17.78 ng/mL

## 2021-05-31 LAB — IRON AND TIBC
Iron: 33 ug/dL — ABNORMAL LOW (ref 45–182)
Saturation Ratios: 25 % (ref 17.9–39.5)
TIBC: 134 ug/dL — ABNORMAL LOW (ref 250–450)
UIBC: 101 ug/dL

## 2021-05-31 LAB — FOLATE: Folate: 17.3 ng/mL (ref >5.9)

## 2021-05-31 LAB — FERRITIN: Ferritin: 1461 ng/mL — ABNORMAL HIGH (ref 24–336)

## 2021-05-31 LAB — APTT: aPTT: 39 seconds — ABNORMAL HIGH (ref 24–36)

## 2021-05-31 MED ORDER — LACTATED RINGERS IV SOLN: INTRAVENOUS | Status: DC

## 2021-05-31 MED ORDER — AMIODARONE HCL IN DEXTROSE 360-4.14 MG/200ML-% IV SOLN
60.0000 mg/h | INTRAVENOUS | Status: AC
  Administered 2021-05-31: 60 mg/h via INTRAVENOUS
  Filled 2021-05-31 (×2): qty 200

## 2021-05-31 MED ORDER — LACTATED RINGERS IV BOLUS (SEPSIS): 1000.0000 mL | Freq: Once | INTRAVENOUS | Status: DC

## 2021-05-31 MED ORDER — TAMSULOSIN HCL 0.4 MG PO CAPS
0.4000 mg | ORAL_CAPSULE | Freq: Every day | ORAL | Status: DC
  Administered 2021-05-31: 0.4 mg via ORAL
  Filled 2021-05-31: qty 1

## 2021-05-31 MED ORDER — ENOXAPARIN SODIUM 40 MG/0.4ML IJ SOSY
40.0000 mg | PREFILLED_SYRINGE | INTRAMUSCULAR | Status: DC
  Administered 2021-05-31: 40 mg via SUBCUTANEOUS
  Filled 2021-05-31: qty 0.4

## 2021-05-31 MED ORDER — AMIODARONE LOAD VIA INFUSION
150.0000 mg | Freq: Once | INTRAVENOUS | Status: AC
  Administered 2021-05-31: 150 mg via INTRAVENOUS
  Filled 2021-05-31: qty 83.34

## 2021-05-31 MED ORDER — ACETAMINOPHEN 500 MG PO TABS
1000.0000 mg | ORAL_TABLET | Freq: Once | ORAL | Status: AC
  Administered 2021-05-31: 1000 mg via ORAL
  Filled 2021-05-31: qty 2

## 2021-05-31 MED ORDER — INSULIN ASPART 100 UNIT/ML IJ SOLN: 0.0000 [IU] | Freq: Three times a day (TID) | INTRAMUSCULAR | Status: DC

## 2021-05-31 MED ORDER — ONDANSETRON HCL 4 MG/2ML IJ SOLN
4.0000 mg | Freq: Once | INTRAMUSCULAR | Status: AC
  Administered 2021-05-31: 4 mg via INTRAVENOUS
  Filled 2021-05-31: qty 2

## 2021-05-31 MED ORDER — LACTATED RINGERS IV BOLUS (SEPSIS)
1000.0000 mL | Freq: Once | INTRAVENOUS | Status: AC
  Administered 2021-05-31: 1000 mL via INTRAVENOUS

## 2021-05-31 MED ORDER — LACTATED RINGERS IV BOLUS
1000.0000 mL | Freq: Once | INTRAVENOUS | Status: AC
  Administered 2021-05-31: 1000 mL via INTRAVENOUS

## 2021-05-31 MED ORDER — DIGOXIN 0.25 MG/ML IJ SOLN
0.2500 mg | Freq: Once | INTRAMUSCULAR | Status: AC
  Administered 2021-05-31: 0.25 mg via INTRAVENOUS
  Filled 2021-05-31 (×2): qty 2

## 2021-05-31 MED ORDER — SODIUM CHLORIDE 0.9 % IV SOLN
1.0000 g | Freq: Two times a day (BID) | INTRAVENOUS | Status: DC
  Administered 2021-05-31: 1 g via INTRAVENOUS
  Filled 2021-05-31: qty 1

## 2021-05-31 MED ORDER — SODIUM CHLORIDE 0.9 % IV SOLN
2.0000 g | INTRAVENOUS | Status: DC
  Administered 2021-05-31: 2 g via INTRAVENOUS
  Filled 2021-05-31: qty 20

## 2021-05-31 MED ORDER — SODIUM CHLORIDE 0.9 % IV SOLN
500.0000 mg | INTRAVENOUS | Status: DC
  Administered 2021-05-31 – 2021-06-01 (×2): 500 mg via INTRAVENOUS
  Filled 2021-05-31 (×2): qty 500

## 2021-05-31 MED ORDER — AMIODARONE HCL IN DEXTROSE 360-4.14 MG/200ML-% IV SOLN
30.0000 mg/h | INTRAVENOUS | Status: DC
Start: 2021-05-31 — End: 2021-06-01
  Administered 2021-05-31: 30 mg/h via INTRAVENOUS
  Filled 2021-05-31: qty 200

## 2021-05-31 NOTE — ED Notes (Signed)
Pt is on bi-pap saturation 91-97, if mask is removed for any length of time it drops to mid 80

## 2021-05-31 NOTE — ED Provider Notes (Signed)
[facility] Emergency Department Provider Note   ____________________________________________   Event Date/Time   First MD Initiated Contact with Patient 06/15/2021 0028     (approximate)  I have reviewed the triage vital signs and the nursing notes.   HISTORY  Chief Complaint Shortness of Breath    HPI Edwin Febles. is a 75 y.o. male brought to the ED via EMS from home with a chief complaint of respiratory distress.  Patient endorses cough, chest tightness and shortness of breath.  EMS reports room air saturations in the 80%'s and diffuse rales on examination.  Brought to the ED on CPAP.  Patient denies abdominal pain, nausea, vomiting or diarrhea.  No prior diagnosis of COPD or CHF.  Noted BLE swelling.     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  Prior to Admission medications   Medication Sig Start Date End Date Taking? Authorizing Provider  allopurinol (ZYLOPRIM) 300 MG tablet Take 300 mg by mouth daily. 05/20/21  Yes [provider]  amLODipine (NORVASC) 10 MG tablet Take 10 mg by mouth daily. 11/30/18  Yes [provider]  aspirin EC 81 MG tablet Take 81 mg by mouth daily.   Yes  [provider]  atorvastatin (LIPITOR) 80 MG tablet Take 80 mg by mouth daily. 11/30/18  Yes [provider]  glucose blood test strip  04/21/13  Yes [provider]  HUMALOG MIX 75/25 KWIKPEN (75-25) 100 UNIT/ML Kwikpen Inject 18-32 Units into the skin 2 (two) times daily. 18 qam 32 qpm 11/08/18  Yes [provider]    Allergies Patient has no known allergies.  No family history on file.  Social History Social History   Tobacco Use   Smoking status: Never   Smokeless tobacco: Never  Substance Use Topics   Alcohol use: Yes    Comment: occassional   Drug use: Never    Review of Systems  Constitutional: No fever/chills Eyes: No visual changes. ENT: No sore throat. Cardiovascular: Positive for chest pain. Respiratory: Positive for cough and shortness of breath. Gastrointestinal: No abdominal pain.  No nausea, no vomiting.  No diarrhea.  No constipation. Genitourinary: Negative for dysuria. Musculoskeletal: Positive for BLE swelling.  Negative for back pain. Skin: Negative for rash. Neurological: Negative for headaches, focal weakness or numbness.   ____________________________________________   PHYSICAL EXAM:  VITAL SIGNS: ED Triage Vitals  Enc Vitals Group     BP      Pulse      Resp      Temp      Temp src      SpO2      Weight      Height      Head Circumference      Peak Flow  Pain Score      Pain Loc      Pain Edu?      Excl. in GC?     Constitutional: Alert and oriented.  Elderly appearing and in moderate acute distress. Eyes: Conjunctivae are normal. PERRL. EOMI. Head: Atraumatic. Nose: No congestion/rhinnorhea. Mouth/Throat: Mucous membranes are mildly dry. Neck: No stridor.   Cardiovascular: Tachycardia rate, regular rhythm. Grossly normal heart sounds.  Good peripheral circulation. Respiratory: Increased respiratory effort.  No retractions. Lungs with rales. Gastrointestinal: Soft and nontender to light  or deep palpation. No distention. No abdominal bruits. No CVA tenderness. Musculoskeletal: No lower extremity tenderness. 1+ BLE pitting edema.  No joint effusions. Neurologic:  Normal speech and language. No gross focal neurologic deficits are appreciated.  Skin:  Skin is warm, dry and intact. No rash noted. Psychiatric: Mood and affect are normal. Speech and behavior are normal.  ____________________________________________   LABS (all labs ordered are listed, but only abnormal results are displayed)  Labs Reviewed  LACTIC ACID, PLASMA - Abnormal; Notable for the following components:      Result Value   Lactic Acid, Venous 3.6 (*)    All other components within normal limits  LACTIC ACID, PLASMA - Abnormal; Notable for the following components:   Lactic Acid, Venous 4.4 (*)    All other components within normal limits  CBC WITH DIFFERENTIAL/PLATELET - Abnormal; Notable for the following components:   WBC 1.7 (*)    RBC 3.33 (*)    Hemoglobin 10.7 (*)    HCT 31.3 (*)    Neutro Abs 1.1 (*)    Lymphs Abs 0.3 (*)    All other components within normal limits  COMPREHENSIVE METABOLIC PANEL - Abnormal; Notable for the following components:   Sodium 130 (*)    Chloride 95 (*)    Glucose, Bld 103 (*)    BUN 76 (*)    Creatinine, Ser 1.65 (*)    Calcium 8.2 (*)    Albumin 3.0 (*)    AST 208 (*)    ALT 111 (*)    Total Bilirubin 2.4 (*)    GFR, Estimated 43 (*)    All other components within normal limits  BRAIN NATRIURETIC PEPTIDE - Abnormal; Notable for the following components:   B Natriuretic Peptide 153.8 (*)    All other components within normal limits  BLOOD GAS, ARTERIAL - Abnormal; Notable for the following components:   pH, Arterial 7.47 (*)    pCO2 arterial 28 (*)    pO2, Arterial 80 (*)    Acid-base deficit 2.3 (*)    All other components within normal limits  TROPONIN I (HIGH SENSITIVITY) - Abnormal; Notable for the following components:   Troponin I (High  Sensitivity) 20 (*)    All other components within normal limits  TROPONIN I (HIGH SENSITIVITY) - Abnormal; Notable for the following components:   Troponin I (High Sensitivity) 72 (*)    All other components within normal limits  RESP PANEL BY RT-PCR (FLU A&B, COVID) ARPGX2  CULTURE, BLOOD (ROUTINE X 2)  CULTURE, BLOOD (ROUTINE X 2)  URINE CULTURE  PROCALCITONIN  URINALYSIS, COMPLETE (UACMP) WITH MICROSCOPIC   ____________________________________________  EKG  ED ECG REPORT I, Essie Gehret J, the attending physician, personally viewed and interpreted this ECG.   Date: 06/14/2021  EKG Time: 0038  Rate: 108  Rhythm: sinus tachycardia  Axis: Normal  Intervals:none  ST&T Change: Nonspecific  ____________________________________________  RADIOLOGY Tawni Millers J, personally viewed and evaluated these  images (plain radiographs) as part of my medical decision making, as well as reviewing the written report by the radiologist.  ED MD interpretation:  patchy opacity suspicious for pneumonia, also pulmonary edema  Official radiology report(s): DG Chest Port 1 View  Result Date: 06/24/2021 CLINICAL DATA:  Shortness of breath.  Hypoxia. EXAM: PORTABLE CHEST 1 VIEW COMPARISON:  None. FINDINGS: Patchy opacity at the lung bases and perihilar regions which obscure the heart borders, heart is grossly normal in size. There is aortic atherosclerosis. Possible small left pleural effusion. No pneumothorax. No acute osseous abnormalities are seen. IMPRESSION: Patchy opacity at the lung bases and perihilar regions, suspicious for pneumonia. Possibility of pulmonary edema is also raised. Possible small left pleural effusion. Electronically Signed   By: Narda RutherfordMelanie  Sanford M.D.   On: 06/19/2021 00:54    ____________________________________________   PROCEDURES  Procedure(s) performed (including Critical Care):  .1-3 Lead EKG Interpretation  Date/Time: 06/10/2021 12:38 AM Performed by: Irean HongSung, Talecia Sherlin J,  MD Authorized by: Irean HongSung, Avan Gullett J, MD     Interpretation: abnormal     ECG rate:  108   ECG rate assessment: tachycardic     Rhythm: sinus tachycardia     Ectopy: none     Conduction: normal   Comments:     Patient placed on cardiac monitor to evaluate for arrhythmias  CRITICAL CARE Performed by: Irean HongSUNG,Hersel Mcmeen J   Total critical care time: 45 minutes  Critical care time was exclusive of separately billable procedures and treating other patients.  Critical care was necessary to treat or prevent imminent or life-threatening deterioration.  Critical care was time spent personally by me on the following activities: development of treatment plan with patient and/or surrogate as well as nursing, discussions with consultants, evaluation of patient's response to treatment, examination of patient, obtaining history from patient or surrogate, ordering and performing treatments and interventions, ordering and review of laboratory studies, ordering and review of radiographic studies, pulse oximetry and re-evaluation of patient's condition.    ____________________________________________   INITIAL IMPRESSION / ASSESSMENT AND PLAN / ED COURSE  As part of my medical decision making, I reviewed the following data within the electronic MEDICAL RECORD NUMBER Nursing notes reviewed and incorporated, Labs reviewed, EKG interpreted, Old chart reviewed, Radiograph reviewed, Discussed with admitting physician, and Notes from prior ED visits     75 year old male presenting in respiratory distress with hypoxia. Differential includes, but is not limited to, viral syndrome, bronchitis including COPD exacerbation, pneumonia, reactive airway disease including asthma, CHF including exacerbation with or without pulmonary/interstitial edema, pneumothorax, ACS, thoracic trauma, and pulmonary embolism.   Will obtain sepsis protocol lab work, COVID swab, chest x-ray.  Will place on BiPAP.  Anticipate  hospitalization.  Clinical Course as of 05/31/21 0450  Tue May 31, 2021  0040 94% on BiPAP [JS]  0109 CXR and WBC noted. Code sepsis initiated. 1L LR for now until lactate returns as cxr also demonstrates CHF.  [JS]  0111 Sepsis reassessment: 4.4.  Rectal temperature 100.5, will administer Tylenol.  98% on BiPAP.  Hemodynamically stable.  IV LR infusing. [JS]  0145 Elevated LFTs and kidney function noted.  Last labs from 2013 which did not include LFTs.  Patient not experiencing abdominal pain.  No signs of jaundice. [JS]  0153 Noted elevated procalcitonin.  Will add second liter IV LR. [JS]  0210 Discussed with hospitalist services for admission. [JS]    Clinical Course User Index [JS] Irean HongSung, Jaquanna Ballentine J, MD     ____________________________________________  FINAL CLINICAL IMPRESSION(S) / ED DIAGNOSES  Final diagnoses:  Respiratory distress  Hypoxia  Community acquired pneumonia, unspecified laterality  Sepsis, due to unspecified organism, unspecified whether acute organ dysfunction present [facility])     ED Discharge Orders     None        Note:  This document was prepared using Dragon voice recognition software and may include unintentional dictation errors.    Irean Hong, MD 06-14-2021 [phone]

## 2021-05-31 NOTE — ED Notes (Signed)
Pt still unable to provide urine sample at this time. Checked CBG and rectal temp and placed pt on bedpan per request.

## 2021-05-31 NOTE — ED Triage Notes (Signed)
EMS brought patient from home for c/ shortness of breath, hypoxia 80's; placed on BiPAP; alert/ oriented x4; skin hot/ dry; respirations fair at this time

## 2021-05-31 NOTE — ED Notes (Signed)
Contacted Mrs. Henningsen (wife) [phone]; gave update

## 2021-05-31 NOTE — ED Notes (Signed)
Notified MD Of critical WBC count 1.1 - additional labs pending per lab tech

## 2021-05-31 NOTE — Progress Notes (Signed)
PHARMACY - PHYSICIAN COMMUNICATION CRITICAL VALUE ALERT - BLOOD CULTURE IDENTIFICATION (BCID)  Edwin Wagner. is an 75 y.o. male who presented to [facility] on 2021/06/30 with a chief complaint of shortness of breath  Assessment:  8/2 blood culture with GPC and GPR, BCID detects S pneumoniae.  Source is lungs.  Name of physician (or Provider) Contacted: Dr Chipper Herb  Current antibiotics: Cefepime and azithromycin  Changes to prescribed antibiotics recommended:  Recommendations accepted by provider - change to ceftriaxone  Results for orders placed or performed during the hospital encounter of June 30, 2021  Blood Culture ID Panel (Reflexed) (Collected: 2021-06-30 12:47 AM)  Result Value Ref Range   Enterococcus faecalis NOT DETECTED NOT DETECTED   Enterococcus Faecium NOT DETECTED NOT DETECTED   Listeria monocytogenes NOT DETECTED NOT DETECTED   Staphylococcus species NOT DETECTED NOT DETECTED   Staphylococcus aureus (BCID) NOT DETECTED NOT DETECTED   Staphylococcus epidermidis NOT DETECTED NOT DETECTED   Staphylococcus lugdunensis NOT DETECTED NOT DETECTED   Streptococcus species DETECTED (A) NOT DETECTED   Streptococcus agalactiae NOT DETECTED NOT DETECTED   Streptococcus pneumoniae DETECTED (A) NOT DETECTED   Streptococcus pyogenes NOT DETECTED NOT DETECTED   A.calcoaceticus-baumannii NOT DETECTED NOT DETECTED   Bacteroides fragilis NOT DETECTED NOT DETECTED   Enterobacterales NOT DETECTED NOT DETECTED   Enterobacter cloacae complex NOT DETECTED NOT DETECTED   Escherichia coli NOT DETECTED NOT DETECTED   Klebsiella aerogenes NOT DETECTED NOT DETECTED   Klebsiella oxytoca NOT DETECTED NOT DETECTED   Klebsiella pneumoniae NOT DETECTED NOT DETECTED   Proteus species NOT DETECTED NOT DETECTED   Salmonella species NOT DETECTED NOT DETECTED   Serratia marcescens NOT DETECTED NOT DETECTED   Haemophilus influenzae NOT DETECTED NOT DETECTED   Neisseria meningitidis NOT DETECTED NOT DETECTED    Pseudomonas aeruginosa NOT DETECTED NOT DETECTED   Stenotrophomonas maltophilia NOT DETECTED NOT DETECTED   Candida albicans NOT DETECTED NOT DETECTED   Candida auris NOT DETECTED NOT DETECTED   Candida glabrata NOT DETECTED NOT DETECTED   Candida krusei NOT DETECTED NOT DETECTED   Candida parapsilosis NOT DETECTED NOT DETECTED   Candida tropicalis NOT DETECTED NOT DETECTED   Cryptococcus neoformans/gattii NOT DETECTED NOT DETECTED    Juliette Alcide, PharmD, BCPS.   Work Cell: [phone] 06/30/2021 2:02 PM

## 2021-05-31 NOTE — ED Notes (Signed)
MD Lorene Dy. Notified of Lactic Acid of 4.4

## 2021-05-31 NOTE — Progress Notes (Signed)
*  PRELIMINARY RESULTS* Echocardiogram 2D Echocardiogram has been performed.  Cristela Blue 2021-06-05, 1:22 PM

## 2021-05-31 NOTE — Progress Notes (Addendum)
PROGRESS NOTE    Edwin Wagner.  HOZ:[id number] DOB: 1945-11-01 DOA: 06/24/2021 PCP: Albina Billet, MD   Chief complaint.  Shortness of breath. Brief Narrative:  Edwin Wagner. is a 75 y.o. male with history of hypertension, diabetes mellitus, hyperlipidemia has been feeling short of breath for the last 5 days unable to bring up phlegm out and has been feeling weak.  Per patient and wife, patient was a previously healthy, not on any oxygen, does not use BiPAP.  Started sick on Thursday.  Has a cough, short of breath progressively getting worse.  Had loose stools daily.  Nausea, no vomiting.  High fever with arriving the hospital with temperature 101. Patient developed severe acute respiratory failure, was placed on BiPAP overnight.  Chest x-ray shows a patchy opacities in the bases, concerning for pneumonia.  Procalcitonin level 17.78, lactic acid 4.4, BNP 153.  Troponin 206 at peak.  He also had neutropenia of 1.1 and INR of 1.4.  CBC showed increased nucleated RBC as well as reticular RBC. His blood pressure was low in the emergency room, he received a fluid bolus.  He is placed on antibiotics with Rocephin and Zithromax.   Assessment & Plan:   Principal Problem:   Sepsis ([place]) Active Problems:   History of TIA (transient ischemic attack)   Hypertension   Type 2 diabetes mellitus with both eyes affected by proliferative retinopathy and macular edema, with long-term current use of insulin (HCC)   Acute respiratory failure with hypoxia (HCC)   Elevated LFTs   CAP (community acquired pneumonia)  Acute hypoxemic respiratory failure secondary to pneumonia. Severe sepsis with septic shock secondary to pneumonia. POA. Bilateral lower lobe pneumonia. Elevated troponin secondary to septic shock. Acute kidney injury secondary to septic shock. Liver function changes secondary to septic shock Leukopenia secondary to septic shock. Patient condition is critical.  He has septic shock associated  with acute hypoxemic respite failure.  He met sepsis criteria, his lactic acid level was more than 4, he has low blood pressure, still requiring BiPAP.  He will be admitted to ICU stepdown unit until we can wean down BiPAP.  If condition deteriorates, he need to be intubated.  We will monitor closely in stepdown unit. Currently blood pressures seem to better after fluid bolus.  Continue antibiotics with Zithromax, due to severity of disease, I will change antibiotic from Rocephin to cefepime. Continue IV fluids. Patient also developed elevated INR, leukopenia, peripheral smear showed elevated immature RBCs, patient could have early DIC from septic shock. Short-term prognosis is guarded.  Type 2 diabetes. Continue sliding scale insulin for now.  Hyponatremia. Continue to follow.  Anemia. Secondary to septic shock.  Continue to follow.  Essential hypertension. Hold off all blood pressure medicines due to low blood pressure.    Urinary retention. Patient cannot urinate, residual more than 500 mL.  We will send UA and urine culture, and her Foley catheter.  Patient could have UTI.  Also start Flomax.  1400.  Blood culture came back with Streptococcus pneumonia, antibiotic switched back to Rocephin.  1500.  Reexamined the patient, patient has atrial fibrillation with rapid ventricular response on telemetry, blood pressure is more stable.  We will start amnio drip also give a dose of IV digoxin 0.25 mg.  DVT prophylaxis: Lovenox Code Status: full Family Communication: Wife updated at the bedside. Disposition Plan:    Status is: Inpatient  Remains inpatient appropriate because:Hemodynamically unstable, IV treatments appropriate due to intensity of  illness or inability to take PO, and Inpatient level of care appropriate due to severity of illness  Dispo: The patient is from: Home              Anticipated d/c is to: Home              Patient currently is not medically stable to d/c.    Difficult to place patient No        I/O last 3 completed shifts: In: 1350 [IV Piggyback:1350] Out: -  No intake/output data recorded.     Consultants:  None  Procedures: None  Antimicrobials: Cefepime and Zithromax.  Subjective: Patient is still on BiPAP, short of breath with minimal exertion.  Cough, nonproductive. Fever, no chills. He could not urinate today, spoke to nurse will get a bladder scan. No abdominal pain or nausea vomiting.  No additional diarrhea since arriving the hospital. No chest pain or palpitation.  Objective: Vitals:   06/27/2021 0720 06/15/2021 0730 06/13/2021 0745 06/15/2021 0745  BP:  111/61 96/81   Pulse:  95 96   Resp:  (!) 32 (!) 32   Temp:    98.3 F (36.8 C)  TempSrc:    Rectal  SpO2: 93% 92% 94%   Weight:      Height:        Intake/Output Summary (Last 24 hours) at 06/08/2021 0834 Last data filed at 05/30/2021 0310 Gross per 24 hour  Intake 1350 ml  Output --  Net 1350 ml   Filed Weights   06/21/2021 0035  Weight: 83.9 kg    Examination:  General exam: Appears calm and comfortable  Respiratory system: Rales bilaterally. Respiratory effort normal. Cardiovascular system: Regular tachycardic. no JVD, murmurs, rubs, gallops or clicks. No pedal edema. Gastrointestinal system: Abdomen is nondistended, soft and nontender. No organomegaly or masses felt. Normal bowel sounds heard. Central nervous system: Alert and oriented x3. No focal neurological deficits. Extremities: Symmetric 5 x 5 power. Skin: No rashes, lesions or ulcers Psychiatry: Judgement and insight appear normal. Mood & affect appropriate.     Data Reviewed: I have personally reviewed following labs and imaging studies  CBC: Recent Labs  Lab 06/03/2021 0047 06/24/2021 0517  WBC 1.7* 1.1*  NEUTROABS 1.1* 0.8*  HGB 10.7* 9.8*  HCT 31.3* 28.6*  MCV 94.0 93.2  PLT 204 637   Basic Metabolic Panel: Recent Labs  Lab 06/08/2021 0047 06/19/2021 0517  NA 130* 131*  K 3.6 4.0   CL 95* 98  CO2 22 22  GLUCOSE 103* 95  BUN 76* 77*  CREATININE 1.65* 1.60*  CALCIUM 8.2* 7.9*   GFR: Estimated Creatinine Clearance: 42.5 mL/min (A) (by C-G formula based on SCr of 1.6 mg/dL (H)). Liver Function Tests: Recent Labs  Lab 05/30/2021 0047 06/19/2021 0517  AST 208* 183*  ALT 111* 97*  ALKPHOS 49 39  BILITOT 2.4* 2.0*  PROT 7.9 6.6  ALBUMIN 3.0* 2.4*   No results for input(s): LIPASE, AMYLASE in the last 168 hours. No results for input(s): AMMONIA in the last 168 hours. Coagulation Profile: Recent Labs  Lab 06/03/2021 0517  INR 1.4*   Cardiac Enzymes: No results for input(s): CKTOTAL, CKMB, CKMBINDEX, TROPONINI in the last 168 hours. BNP (last 3 results) No results for input(s): PROBNP in the last 8760 hours. HbA1C: No results for input(s): HGBA1C in the last 72 hours. CBG: Recent Labs  Lab 06/22/2021 0737  GLUCAP 92   Lipid Profile: No results for input(s): CHOL, HDL, LDLCALC, TRIG,  CHOLHDL, LDLDIRECT in the last 72 hours. Thyroid Function Tests: No results for input(s): TSH, T4TOTAL, FREET4, T3FREE, THYROIDAB in the last 72 hours. Anemia Panel: Recent Labs    06/16/2021 0517  FOLATE 17.3  FERRITIN 1,461*  TIBC 134*  IRON 33*  RETICCTPCT 3.0   Sepsis Labs: Recent Labs  Lab 06/19/2021 0047 06/21/2021 0258 06/05/2021 0517 06/04/2021 0658  PROCALCITON 17.78  --   --   --   LATICACIDVEN 4.4* 3.6* 3.4* 3.3*    Recent Results (from the past 240 hour(s))  Culture, blood (routine x 2)     Status: None (Preliminary result)   Collection Time: 06/28/2021 12:47 AM   Specimen: BLOOD  Result Value Ref Range Status   Specimen Description BLOOD LEFT HAND  Final   Special Requests   Final    BOTTLES DRAWN AEROBIC AND ANAEROBIC Blood Culture adequate volume   Culture   Final    NO GROWTH < 12 HOURS Performed at [facility], [address] [place], [place] [address]    Report Status PENDING  Incomplete  Culture, blood (routine x 2)     Status: None  (Preliminary result)   Collection Time: 06/15/2021 12:47 AM   Specimen: BLOOD  Result Value Ref Range Status   Specimen Description BLOOD RIGHT ASSIST CONTROL  Final   Special Requests   Final    BOTTLES DRAWN AEROBIC AND ANAEROBIC Blood Culture adequate volume   Culture   Final    NO GROWTH < 12 HOURS Performed at [facility], [address] [place], [place] [address]    Report Status PENDING  Incomplete  Resp Panel by RT-PCR (Flu A&B, Covid) Nasopharyngeal Swab     Status: None   Collection Time: 05/30/2021 12:47 AM   Specimen: Nasopharyngeal Swab; Nasopharyngeal(NP) swabs in vial transport medium  Result Value Ref Range Status   SARS Coronavirus 2 by RT PCR NEGATIVE NEGATIVE Final    Comment: (NOTE) SARS-CoV-2 target nucleic acids are NOT DETECTED.  The SARS-CoV-2 RNA is generally detectable in upper respiratory specimens during the acute phase of infection. The lowest concentration of SARS-CoV-2 viral copies this assay can detect is 138 copies/mL. A negative result does not preclude SARS-Cov-2 infection and should not be used as the sole basis for treatment or other patient management decisions. A negative result may occur with  improper specimen collection/handling, submission of specimen other than nasopharyngeal swab, presence of viral mutation(s) within the areas targeted by this assay, and inadequate number of viral copies(<138 copies/mL). A negative result must be combined with clinical observations, patient history, and epidemiological information. The expected result is Negative.  Fact Sheet for Patients:  EntrepreneurPulse.com.au  Fact Sheet for Healthcare Providers:  IncredibleEmployment.be  This test is no t yet approved or cleared by the [place] FDA and  has been authorized for detection and/or diagnosis of SARS-CoV-2 by FDA under an Emergency Use Authorization (EUA). This EUA will remain  in effect (meaning  this test can be used) for the duration of the COVID-19 declaration under Section 564(b)(1) of the Act, 21 U.S.C.section 360bbb-3(b)(1), unless the authorization is terminated  or revoked sooner.       Influenza A by PCR NEGATIVE NEGATIVE Final   Influenza B by PCR NEGATIVE NEGATIVE Final    Comment: (NOTE) The Xpert Xpress SARS-CoV-2/FLU/RSV plus assay is intended as an aid in the diagnosis of influenza from Nasopharyngeal swab specimens and should not be used as a sole basis for treatment. Nasal washings  and aspirates are unacceptable for Xpert Xpress SARS-CoV-2/FLU/RSV testing.  Fact Sheet for Patients: EntrepreneurPulse.com.au  Fact Sheet for Healthcare Providers: IncredibleEmployment.be  This test is not yet approved or cleared by the [place] FDA and has been authorized for detection and/or diagnosis of SARS-CoV-2 by FDA under an Emergency Use Authorization (EUA). This EUA will remain in effect (meaning this test can be used) for the duration of the COVID-19 declaration under Section 564(b)(1) of the Act, 21 U.S.C. section 360bbb-3(b)(1), unless the authorization is terminated or revoked.  Performed at [facility], [address] [place], [place] [address]          Radiology Studies: [facility] Chest [place] 1 View  Result Date: 06/23/2021 CLINICAL DATA:  Shortness of breath.  Hypoxia. EXAM: PORTABLE CHEST 1 VIEW COMPARISON:  None. FINDINGS: Patchy opacity at the lung bases and perihilar regions which obscure the heart borders, heart is grossly normal in size. There is aortic atherosclerosis. Possible small left pleural effusion. No pneumothorax. No acute osseous abnormalities are seen. IMPRESSION: Patchy opacity at the lung bases and perihilar regions, suspicious for pneumonia. Possibility of pulmonary edema is also raised. Possible small left pleural effusion. Electronically Signed   By: Keith Rake M.D.   On: 06/29/2021  00:54        Scheduled Meds:  enoxaparin (LOVENOX) injection  40 mg Subcutaneous Q24H   insulin aspart  0-9 Units Subcutaneous TID WC   Continuous Infusions:  azithromycin Stopped (06/02/2021 0310)   cefTRIAXone (ROCEPHIN)  IV Stopped (06/25/2021 0150)   lactated ringers 150 mL/hr at 06/19/2021 0526     LOS: 0 days    Time spent: 35 minutes of critical care time    Sharen Hones, MD [facility]   To contact the attending provider between 7A-7P or the covering provider during after hours 7P-7A, please log into the web site [email or web address] and access using universal [place] password for that web site. If you do not have the password, please call the hospital operator.  06/29/2021, 8:34 AM

## 2021-05-31 NOTE — H&P (Addendum)
History and Physical    [facility]. GHW:[id number] DOB: 1946/04/06 DOA: 06/03/2021  PCP: Jaclyn Shaggy, MD  Patient coming from: Home.  Chief Complaint: Shortness of breath.  History obtained from patient, ER physician and patient and wife.  HPI: Edwin Wagner. is a 75 y.o. male with history of hypertension, diabetes mellitus, hyperlipidemia has been feeling short of breath for the last 2 days unable to bring up phlegm out and has been feeling weak.  Due to the weakness patient had a fall but did not hit his head or lose consciousness.  EMS was called and patient was found to be hypoxic and to be placed on CPAP and was brought to the ER.  ED Course: In the ER patient was febrile tachycardic with fever 101 F labs were showing procalcitonin level of 17.7 lactic acid of 4.4 LFTs were elevated at 288 of AST and 111 of ALT total bilirubin of 2.4 BNP of 153 and x-ray was showing possible pulmonary edema versus infection.  WBC count was 1.7 hemoglobin 10.7 high sensitive troponin was 20 and 72.  Patient was started on fluid bolus placed patient on BiPAP for hypoxia and empiric antibiotic started after blood cultures obtained.  Patient admitted for sepsis and hypoxic respiratory failure secondary to pneumonia.  COVID test is negative.  Review of Systems: As per HPI, rest all negative.   Past Medical History:  Diagnosis Date   Diabetes mellitus without complication (HCC)    Hypertension     History reviewed. No pertinent surgical history.   reports that he has never smoked. He has never used smokeless tobacco. He reports current alcohol use. He reports that he does not use drugs.  No Known Allergies  History reviewed. No pertinent family history.  Prior to Admission medications   Medication Sig Start Date End Date Taking? Authorizing Provider  allopurinol (ZYLOPRIM) 300 MG tablet Take 300 mg by mouth daily. 05/20/21  Yes [provider]  amLODipine (NORVASC) 10 MG tablet  Take 10 mg by mouth daily. 11/30/18  Yes [provider]  aspirin EC 81 MG tablet Take 81 mg by mouth daily.   Yes [provider]  atorvastatin (LIPITOR) 80 MG tablet Take 80 mg by mouth daily. 11/30/18  Yes [provider]  glucose blood test strip  04/21/13  Yes [provider]  HUMALOG MIX 75/25 KWIKPEN (75-25) 100 UNIT/ML Kwikpen Inject 18-32 Units into the skin 2 (two) times daily. 18 qam 32 qpm 11/08/18  Yes [provider]    Physical Exam: Constitutional: Moderately built and nourished. Vitals:   06/11/2021 0230 06/13/2021 0300 06/07/2021 0330 06/02/2021 0430  BP: 115/74 97/61 91/66  (!) 98/53  Pulse: (!) 105 94 95 90  Resp: (!) 21 (!) 26 (!) 25 (!) 21  Temp:      TempSrc:      SpO2: 96% 96% 93% 94%  Weight:      Height:       Eyes: Anicteric no pallor. ENMT: No discharge from the ears eyes nose and mouth. Neck: No mass felt.  No neck rigidity. Respiratory: No rhonchi or crepitations. Cardiovascular: S1-S2 heard. Abdomen: Soft nontender bowel sound present. Musculoskeletal: No edema. Skin: No rash. Neurologic: Alert awake oriented to time place and person.  Moves all extremities. Psychiatric: Appears normal.  Normal affect.   Labs on Admission: I have personally reviewed following labs and imaging studies  CBC: Recent Labs  Lab 06/14/2021 0047  WBC 1.7*  NEUTROABS 1.1*  HGB 10.7*  HCT 31.3*  MCV 94.0  PLT 204   Basic Metabolic Panel: Recent Labs  Lab 06/02/21 0047  NA 130*  K 3.6  CL 95*  CO2 22  GLUCOSE 103*  BUN 76*  CREATININE 1.65*  CALCIUM 8.2*   GFR: Estimated Creatinine Clearance: 41.2 mL/min (A) (by C-G formula based on SCr of 1.65 mg/dL (H)). Liver Function Tests: Recent Labs  Lab 06/02/21 0047  AST 208*  ALT 111*  ALKPHOS 49  BILITOT 2.4*  PROT 7.9  ALBUMIN 3.0*   No results for input(s): LIPASE, AMYLASE in the last 168 hours. No results for input(s): AMMONIA in the last 168 hours. Coagulation  Profile: No results for input(s): INR, PROTIME in the last 168 hours. Cardiac Enzymes: No results for input(s): CKTOTAL, CKMB, CKMBINDEX, TROPONINI in the last 168 hours. BNP (last 3 results) No results for input(s): PROBNP in the last 8760 hours. HbA1C: No results for input(s): HGBA1C in the last 72 hours. CBG: No results for input(s): GLUCAP in the last 168 hours. Lipid Profile: No results for input(s): CHOL, HDL, LDLCALC, TRIG, CHOLHDL, LDLDIRECT in the last 72 hours. Thyroid Function Tests: No results for input(s): TSH, T4TOTAL, FREET4, T3FREE, THYROIDAB in the last 72 hours. Anemia Panel: No results for input(s): VITAMINB12, FOLATE, FERRITIN, TIBC, IRON, RETICCTPCT in the last 72 hours. Urine analysis: No results found for: COLORURINE, APPEARANCEUR, LABSPEC, PHURINE, GLUCOSEU, HGBUR, BILIRUBINUR, KETONESUR, PROTEINUR, UROBILINOGEN, NITRITE, LEUKOCYTESUR Sepsis Labs: @LABRCNTIP (procalcitonin:4,lacticidven:4) ) Recent Results (from the past 240 hour(s))  Resp Panel by RT-PCR (Flu A&B, Covid) Nasopharyngeal Swab     Status: None   Collection Time: 2021-06-02 12:47 AM   Specimen: Nasopharyngeal Swab; Nasopharyngeal(NP) swabs in vial transport medium  Result Value Ref Range Status   SARS Coronavirus 2 by RT PCR NEGATIVE NEGATIVE Final    Comment: (NOTE) SARS-CoV-2 target nucleic acids are NOT DETECTED.  The SARS-CoV-2 RNA is generally detectable in upper respiratory specimens during the acute phase of infection. The lowest concentration of SARS-CoV-2 viral copies this assay can detect is 138 copies/mL. A negative result does not preclude SARS-Cov-2 infection and should not be used as the sole basis for treatment or other patient management decisions. A negative result may occur with  improper specimen collection/handling, submission of specimen other than nasopharyngeal swab, presence of viral mutation(s) within the areas targeted by this assay, and inadequate number of  viral copies(<138 copies/mL). A negative result must be combined with clinical observations, patient history, and epidemiological information. The expected result is Negative.  Fact Sheet for Patients:  07/31/21  Fact Sheet for Healthcare Providers:  BloggerCourse.com  This test is no t yet approved or cleared by the [employer]ious[employer].[place] FDA and  has been authorized for detection and/or diagnosis of SARS-CoV-2 by FDA under an Emergency Use Authorization (EUA). This EUA will remain  in effect (meaning this test can be used) for the duration of the COVID-19 declaration under Section 564(b)(1) of the Act, 21 U.S.C.section 360bbb-3(b)(1), unless the authorization is terminated  or revoked sooner.       Influenza A by PCR NEGATIVE NEGATIVE Final   Influenza B by PCR NEGATIVE NEGATIVE Final    Comment: (NOTE) The Xpert Xpress SARS-CoV-2/FLU/RSV plus assay is intended as an aid in the diagnosis of influenza from Nasopharyngeal swab specimens and should not be used as a sole basis for treatment. Nasal washings and aspirates are unacceptable for Xpert Xpress SARS-CoV-2/FLU/RSV testing.  Fact Sheet for Patients: [place]  Fact  Sheet for Healthcare Providers: SeriousBroker.it  This test is not yet approved or cleared by the [place] and has been authorized for detection and/or diagnosis of SARS-CoV-2 by FDA under an Emergency Use Authorization (EUA). This EUA will remain in effect (meaning this test can be used) for the duration of the COVID-19 declaration under Section 564(b)(1) of the Act, 21 U.S.C. section 360bbb-3(b)(1), unless the authorization is terminated or revoked.  Performed at [facility], [address], [place], [place] [address]      Radiological Exams on Admission: DG Chest [facility] 1 View  Result Date: 06/15/2021 CLINICAL DATA:  Shortness  of breath.  Hypoxia. EXAM: PORTABLE CHEST 1 VIEW COMPARISON:  None. FINDINGS: Patchy opacity at the lung bases and perihilar regions which obscure the heart borders, heart is grossly normal in size. There is aortic atherosclerosis. Possible small left pleural effusion. No pneumothorax. No acute osseous abnormalities are seen. IMPRESSION: Patchy opacity at the lung bases and perihilar regions, suspicious for pneumonia. Possibility of pulmonary edema is also raised. Possible small left pleural effusion. Electronically Signed   By: Narda Rutherford M.D.   On: 06/17/2021 00:54    EKG: Independently reviewed.  Sinus tachycardia.  Assessment/Plan Principal Problem:   Sepsis (HCC) Active Problems:   History of TIA (transient ischemic attack)   Hypertension   Type 2 diabetes mellitus with both eyes affected by proliferative retinopathy and macular edema, with long-term current use of insulin (HCC)   Acute respiratory failure with hypoxia (HCC)   Elevated LFTs   CAP (community acquired pneumonia)    Sepsis secondary to pneumonia for which patient is on empiric antibiotics.  Patient is receiving fluid bolus per sepsis protocol.  Patient blood pressure is in the low normal.  May need vasopressors if blood pressure does not improve with fluids.  Follow lactic acid levels blood cultures. Acute respiratory failure with hypoxia secondary to pneumonia for which patient is presently on BiPAP.  Closely monitor respiratory status. Elevated LFTs could be from sepsis.  Follow LFTs check acute hepatitis panel.  We will check sonogram of the abdomen when patient is more stable. Leukopenia could be from sepsis.  Follow CBC. History of hypertension presently blood pressure is in the low normal holding antihypertensives. Diabetes mellitus type 2 we will keep patient on sliding scale coverage. Hyperlipidemia holding statins due to elevated LFTs. Anemia -no recent labs to compare.  Follow CBC.  Check anemia panel. Acute  renal failure could be from sepsis.  With hypotension.  Hydrate follow metabolic panel.  No recent labs to compare.  Since patient has respiratory failure with sepsis and pneumonia will need close monitoring for any further worsening inpatient status.   DVT prophylaxis: Lovenox. Code Status: Full code. Family Communication: Patient's wife. Disposition Plan: Home when stable. Consults called: None. Admission status: Inpatient.   Eduard Clos MD [facility] Pager [phone].  If 7PM-7AM, please contact night-coverage [email or web address] Password [email or web address]  06/27/2021, 5:00 AM

## 2021-05-31 NOTE — ED Notes (Signed)
Echo at bedside

## 2021-05-31 NOTE — ED Notes (Signed)
Pt feeling nausous now.  Hr is 91-101

## 2021-05-31 NOTE — ED Notes (Signed)
MD Marrion Coy aware of elevated Troponin 156

## 2021-06-01 ENCOUNTER — Encounter: Payer: Self-pay | Admitting: Internal Medicine

## 2021-06-01 ENCOUNTER — Inpatient Hospital Stay: Payer: Medicare Other

## 2021-06-01 DIAGNOSIS — I469 Cardiac arrest, cause unspecified: Secondary | ICD-10-CM

## 2021-06-01 DIAGNOSIS — A419 Sepsis, unspecified organism: Secondary | ICD-10-CM | POA: Diagnosis not present

## 2021-06-01 DIAGNOSIS — R6521 Severe sepsis with septic shock: Secondary | ICD-10-CM | POA: Diagnosis not present

## 2021-06-01 LAB — CBC WITH DIFFERENTIAL/PLATELET
Abs Immature Granulocytes: 0.35 10*3/uL — ABNORMAL HIGH (ref 0.00–0.07)
Basophils Absolute: 0 10*3/uL (ref 0.0–0.1)
Basophils Relative: 0 %
Eosinophils Absolute: 0 10*3/uL (ref 0.0–0.5)
Eosinophils Relative: 0 %
HCT: 29.7 % — ABNORMAL LOW (ref 39.0–52.0)
Hemoglobin: 10.1 g/dL — ABNORMAL LOW (ref 13.0–17.0)
Immature Granulocytes: 5 %
Lymphocytes Relative: 3 %
Lymphs Abs: 0.2 10*3/uL — ABNORMAL LOW (ref 0.7–4.0)
MCH: 32.8 pg (ref 26.0–34.0)
MCHC: 34 g/dL (ref 30.0–36.0)
MCV: 96.4 fL (ref 80.0–100.0)
Monocytes Absolute: 0.3 10*3/uL (ref 0.1–1.0)
Monocytes Relative: 4 %
Neutro Abs: 6.5 10*3/uL (ref 1.7–7.7)
Neutrophils Relative %: 88 %
Platelets: 174 10*3/uL (ref 150–400)
RBC: 3.08 MIL/uL — ABNORMAL LOW (ref 4.22–5.81)
RDW: 12.5 % (ref 11.5–15.5)
WBC: 7.5 10*3/uL (ref 4.0–10.5)
nRBC: 1.3 % — ABNORMAL HIGH (ref 0.0–0.2)

## 2021-06-01 LAB — LACTIC ACID, PLASMA: Lactic Acid, Venous: 4.6 mmol/L (ref 0.5–1.9)

## 2021-06-01 LAB — COMPREHENSIVE METABOLIC PANEL
ALT: 134 U/L — ABNORMAL HIGH (ref 0–44)
AST: 269 U/L — ABNORMAL HIGH (ref 15–41)
Albumin: 2.2 g/dL — ABNORMAL LOW (ref 3.5–5.0)
Alkaline Phosphatase: 43 U/L (ref 38–126)
Anion gap: 13 (ref 5–15)
BUN: 80 mg/dL — ABNORMAL HIGH (ref 8–23)
CO2: 21 mmol/L — ABNORMAL LOW (ref 22–32)
Calcium: 7.6 mg/dL — ABNORMAL LOW (ref 8.9–10.3)
Chloride: 99 mmol/L (ref 98–111)
Creatinine, Ser: 1.56 mg/dL — ABNORMAL HIGH (ref 0.61–1.24)
GFR, Estimated: 46 mL/min — ABNORMAL LOW (ref >60)
Glucose, Bld: 107 mg/dL — ABNORMAL HIGH (ref 70–99)
Potassium: 4.2 mmol/L (ref 3.5–5.1)
Sodium: 133 mmol/L — ABNORMAL LOW (ref 135–145)
Total Bilirubin: 2.6 mg/dL — ABNORMAL HIGH (ref 0.3–1.2)
Total Protein: 6.6 g/dL (ref 6.5–8.1)

## 2021-06-01 LAB — URINE CULTURE: Culture: NO GROWTH

## 2021-06-01 LAB — BRAIN NATRIURETIC PEPTIDE: B Natriuretic Peptide: 386.4 pg/mL — ABNORMAL HIGH (ref 0.0–100.0)

## 2021-06-01 LAB — CBG MONITORING, ED: Glucose-Capillary: 92 mg/dL (ref 70–99)

## 2021-06-01 LAB — GLUCOSE, CAPILLARY: Glucose-Capillary: 71 mg/dL (ref 70–99)

## 2021-06-01 LAB — TROPONIN I (HIGH SENSITIVITY): Troponin I (High Sensitivity): 2256 ng/L (ref <18)

## 2021-06-01 MED ORDER — AMIODARONE HCL 150 MG/3ML IV SOLN
INTRAVENOUS | Status: AC | PRN
  Administered 2021-06-01: 150 mg via INTRAVENOUS

## 2021-06-01 MED ORDER — EPINEPHRINE 1 MG/10ML IJ SOSY
PREFILLED_SYRINGE | INTRAMUSCULAR | Status: AC | PRN
  Administered 2021-06-01: 1 mg via INTRAVENOUS

## 2021-06-01 MED ORDER — ATROPINE SULFATE 1 MG/ML IJ SOLN
INTRAMUSCULAR | Status: AC | PRN
  Administered 2021-06-01: 1 mg via INTRAVENOUS

## 2021-06-01 MED ORDER — NOREPINEPHRINE 4 MG/250ML-% IV SOLN
INTRAVENOUS | Status: AC
  Filled 2021-06-01: qty 250

## 2021-06-01 MED ORDER — EPINEPHRINE HCL 5 MG/250ML IV SOLN IN NS
0.5000 ug/min | INTRAVENOUS | Status: DC
  Filled 2021-06-01: qty 250

## 2021-06-01 MED ORDER — CALCIUM CHLORIDE 10 % IV SOLN
INTRAVENOUS | Status: AC | PRN
  Administered 2021-06-01: 1 g via INTRAVENOUS

## 2021-06-01 MED ORDER — MAGNESIUM SULFATE 50 % IJ SOLN
INTRAMUSCULAR | Status: AC | PRN
  Administered 2021-06-01: 2 g via INTRAVENOUS

## 2021-06-01 MED ORDER — SODIUM BICARBONATE 8.4 % IV SOLN
INTRAVENOUS | Status: AC | PRN
  Administered 2021-06-01: 50 meq via INTRAVENOUS

## 2021-06-01 MED ORDER — EPINEPHRINE 1 MG/10ML IJ SOSY
PREFILLED_SYRINGE | INTRAMUSCULAR | Status: AC | PRN
Start: 2021-06-01 — End: 2021-06-01
  Administered 2021-06-01: 1 mg via INTRAVENOUS

## 2021-06-02 MED FILL — Medication: Qty: 1 | Status: AC

## 2021-06-04 LAB — CULTURE, BLOOD (ROUTINE X 2): Special Requests: ADEQUATE

## 2021-06-05 LAB — CULTURE, BLOOD (ROUTINE X 2)
Culture: NO GROWTH
Special Requests: ADEQUATE

## 2021-06-30 NOTE — ED Provider Notes (Signed)
[facility] Department of Emergency Medicine   Code Blue CONSULT NOTE  Chief Complaint: Cardiac arrest/unresponsive   Level V Caveat: Unresponsive  History of present illness: I was contacted by the hospital for a CODE BLUE cardiac arrest upstairs and presented to the patient's bedside. Patient found to be apneic, bradycardic and unresponsive   ROS: Unable to obtain, Level V caveat  Scheduled Meds:  enoxaparin (LOVENOX) injection  40 mg Subcutaneous Q24H   insulin aspart  0-9 Units Subcutaneous TID WC   tamsulosin  0.4 mg Oral QPC breakfast   Continuous Infusions:  amiodarone 30 mg/hr (June 19, 2021 2133)   azithromycin Stopped (06/04/2021 0253)   cefTRIAXone (ROCEPHIN)  IV Stopped (06/19/21 2337)   lactated ringers Stopped (06/06/2021 0123)   norepinephrine     PRN Meds:. Past Medical History:  Diagnosis Date   Diabetes mellitus without complication (HCC)    Hypertension    History reviewed. No pertinent surgical history. Social History   Socioeconomic History   Marital status: Married    Spouse name: Not on file   Number of children: Not on file   Years of education: Not on file   Highest education level: Not on file  Occupational History   Not on file  Tobacco Use   Smoking status: Never   Smokeless tobacco: Never  Substance and Sexual Activity   Alcohol use: Yes    Comment: occassional   Drug use: Never   Sexual activity: Not on file  Other Topics Concern   Not on file  Social History Narrative   Not on file   Social Determinants of Health   Financial Resource Strain: Not on file  Food Insecurity: Not on file  Transportation Needs: Not on file  Physical Activity: Not on file  Stress: Not on file  Social Connections: Not on file  Intimate Partner Violence: Not on file   No Known Allergies  Last set of Vital Signs (not current) Vitals:   06/21/2021 0332 06/27/2021 0333  BP:    Pulse: (!) 38 77  Resp: 19 (!) 22  Temp:    SpO2: 95%  95%      Physical Exam  Gen: unresponsive Cardiovascular: pulseless  Resp: apneic. Breath sounds equal bilaterally with bagging  Abd: nondistended  Neuro: GCS 3, unresponsive to pain  HEENT: No blood in posterior pharynx, gag reflex absent  Neck: No crepitus  Musculoskeletal: No deformity  Skin: warm  Procedures  INTUBATION Performed by: Nita Sickle Required items: required blood products, implants, devices, and special equipment available Patient identity confirmed: provided demographic data and hospital-assigned identification number Time out: Immediately prior to procedure a "time out" was called to verify the correct patient, procedure, equipment, support staff and site/side marked as required. Indications: cardiac arrest Intubation method: Video laryngoscopy Preoxygenation: BVM Sedatives: none Paralytic: none Tube Size: 7.5 cuffed Post-procedure assessment: chest rise and ETCO2 monitor Breath sounds: equal and absent over the epigastrium Tube secured by Respiratory Therapy Patient tolerated the procedure well with no immediate complications.  CRITICAL CARE Performed by: Nita Sickle Total critical care time: 30 min Critical care time was exclusive of separately billable procedures and treating other patients. Critical care was necessary to treat or prevent imminent or life-threatening deterioration. Critical care was time spent personally by me on the following activities: development of treatment plan with patient and/or surrogate as well as nursing, discussions with consultants, evaluation of patient's response to treatment, examination of patient, obtaining history from patient or surrogate, ordering and  performing treatments and interventions, ordering and review of laboratory studies, ordering and review of radiographic studies, pulse oximetry and re-evaluation of patient's condition.  Cardiopulmonary Resuscitation (CPR) Procedure  Note  Directed/Performed by: Nita Sickle I personally directed ancillary staff and/or performed CPR in an effort to regain return of spontaneous circulation and to maintain cardiac, neuro and systemic perfusion.    Medical Decision making Virgina Organ and Plan  72M admitted for pneumonia who went into cardiac arrest in the emergency department.  Upon arrival to the room patient was unresponsive, apneic and bradycardic with a very faint pulse.  Patient immediately lost pulses, with initial rhythm of PEA.  Patient was bagged with BVM and CPR was initiated per ACLS protocol.  Patient was given epinephrine, intubated with ROSC.  Patient started having several short runs of V. tach for which he received amiodarone, magnesium, calcium, and bicarb.  Patient lost pulses again and went into PEA arrest again.  CPR resumed with another round of epinephrine and ROSC achieved.  Patient was hypotensive and started on Levophed.  Unfortunately patient's neurological exam is very poor not requiring any sedation on the ventilator or paralytic/sedative agents for intubation. Care transferred to the hospitalist Dr. Fredrich Romans who accompanied patient's transport to the ICU.    Nita Sickle, MD 06/27/2021 [phone]

## 2021-06-30 NOTE — Progress Notes (Signed)
Chaplain provided emotional and spiritual support to wife during Code event and death. Wife was in constant communication with her children who were all out of town. Family was be supportive while in shock of Edwin Wagner's rapid decline. Family was educated on next steps including call the hospital with a funeral home once selected.     June 11, 2021 0500  Clinical Encounter Type  Visited With Patient and family together  Visit Type Critical Care;Death  Referral From Nurse  Spiritual Encounters  Spiritual Needs Grief support;Emotional;Prayer

## 2021-06-30 NOTE — Progress Notes (Signed)
Rapid Response called, RT x3 responded. Code Blue initiated upon arrival. Patient intubated by ED MD and placed on ventilator. Transported to ICU shortly after where patient coded several more times. RT x2 remained with patient throughout codes.

## 2021-06-30 NOTE — Code Documentation (Addendum)
CARDIOPULMONARY RESUSCITATION   Directed/Performed by: Webb Silversmith, DNP, CCRN, FNP-C, AGACNP-BC I personally directed ancillary staff and/or performed CPR in an effort to regain return of spontaneous circulation and to maintain cardiac, neuro and systemic perfusion.    Medical Decision making Virgina Organ and Plan  48M admitted for pneumonia who went into cardiac arrest in the emergency department. Upon arrival to the ICU, patient's heart rate went into wide bizarre complex polymorphic vtach requiring defibrillation x 6. He was initially shocked out of the rhythm with 100J, then 120J>150J>200J X 2 with return initially to an organized rhythm then went into bradycardic requiring two rounds of Epi and Atropine. See meds administered below. Patient then immediately lost pulses, with  rhythm of PEA.  Patient was bagged with BVM and CPR was initiated per ACLS protocol.  Patient was given additional rounds of  epinephrine,.  Patient started having several short runs of V. tach for which he received  magnesium, calcium, and bicarb. Patient was hypotensive and remained on Levophed. He was started on Epi infusion and Dopamine with improvement in HR. 12 Lead EKG was obtained which showed STEMI in anteroseptal leads. CODE STEMI was activated but unfortunately patient lost pulse and CPR resumed. Patient's wife requested the chaplain that she would wish to be in the room during resuscitative efforts. She was updated on the condition of the patient and poor prognosis. At that time she made the decision to stop the resuscitative efforts as patient would not wish to be in a vegetative state. CPR/ACLS was stopped and patient pronounced dead at 0500 am. The attending for the patient Dr. Toniann Fail was notified of the time of death and will complete the death summary  Medications Administered Include:      Yes/no Amiodarone yes  Atropine yes  Calcium yes  Epinephrine yes  Lidocaine no  Magnesium yes  Norepinephrine  yes  Phenylephrine no  Sodium bicarbonate yes  Vasopression no   The following conditions were reviewed and addressed during the resuscitative efforts ACLS H's and T's  -Hypovolemia  -Hypoxia -Hydrogen Ion excess (acidosis) -Hypoglycemia -Hypokalemia / Hyperkalemia  -Hypothermia  -Tension pneumothorax  -Toxins  -Thrombosis PE / MI   Additional CC time 32 mins   Webb Silversmith, DNP, CCRN, FNP-C, AGACNP-BC Acute Care Nurse Practitioner  [facility] Pager: [phone] [place] at [facility]

## 2021-06-30 NOTE — Sepsis Progress Note (Signed)
Pt arrived to ICU from ED as Code Blue. Checked orders and patient was not a Code Sepsis, but admitting diagnosis is severe sepsis with septic shock. It is noted per [facility] that the patient received ABX in the ED on June 07, 2021 as well as IVF resuscitation that assisted with the LA. Initial LA resulted at June 07, 2021 at 4.4 and trended down to 3.3 but is back up at 4.6  06/26/2021 @ 0124 AM. ED RN did notify ED MD about the elevated LA on 8/2 at 0133 AM that was subsequently treated.   Frimy Uffelman DNP Pola Corn RN [phone] AM

## 2021-06-30 NOTE — Code Documentation (Signed)
Pulse check- organized rhythm. Intubated at 0325 7 1/2 23 at the lip. !6 french OG placed 0332. No pulse CPR restarted 0335. IV 20 r hand 0337. Pulse check organized rhythm 0338.  Levophed drip started 0340 30mc/min. Titrate to 4 at 0345. Titrate to 6 at 0347. Titrate to 8 at 0349. Titrate to 10 at 0351.

## 2021-06-30 NOTE — ED Notes (Signed)
Drew blood, started antibiotics, put vaseline on lips, cleaned mouth, pt resting

## 2021-06-30 NOTE — Progress Notes (Signed)
Patient arrived to the CCU from the ED. Vital signs unstable and ACLS immediately initiated. Patient wife updated on patient condition and called to the bedside. Patient maxed on Levophed, Epi, and Dopamine; shocked x 5 during event. Patient wife requested that we stop efforts.

## 2021-06-30 NOTE — Discharge Summary (Signed)
Death summary -    Chief complaint -shortness of breath.  Course in the hospital -75 year old male with history of hypertension and diabetes mellitus presented to the ER with complaints of having shortness of breath 2 days prior to coming to the hospital.  Patient was having cough unable to bring phlegm out.  Was feeling weak and had a fall prior to coming.  Prior to coming patient had to be placed on CPAP by the EMS.  In the ER patient was febrile tachycardic with a fever of 101 F blood pressures were in the low normal.  Procalcitonin was elevated lactic acid was elevated. LFTs were elevated with AST of 288 and ALT of 111.  Total bilirubin of 2.4.  X-ray showed possible pulmonary edema versus infection.  WBC count was 1.7.  Hemoglobin 10.7.  High sensitive troponin was 20 repeat was 72.  Patient was hypoxic and to be placed on BiPAP.  Patient was treated for sepsis secondary to pneumonia.  Patient was on antibiotics and fluids.  Patient's COVID test was negative.  Subsequently patient went into A. fib with RVR for which patient was started on amiodarone drip.  Patient's blood cultures came back as Streptococcus pneumonia.  At around 3:54 AM on 19-Jun-2021 patient went into PEA arrest and had to be resuscitated.  Patient was intubated.  Patient was transferred to ICU.  In the ICU patient again had a cardiac arrest patient went into V. tach and EKG subsequently showed ST elevation MI with elevated troponin.  Code STEMI was activated and cardiology was consulted.  Patient was on 3 vasopressors.  Since patient's condition did not improve after discussing with the family patient's wife decided to stop resuscitation effort and patient was pronounced dead on 06/19/2021 at 5 AM.  Final diagnosis -   #1.  Septic shock secondary to Streptococcus pneumonia. #2.  Community-acquired pneumonia. #3.  Acute respiratory failure with hypoxia on BiPAP secondary to pneumonia. #4.  A. fib with RVR precipitated by  sepsis. #5.  History of diabetes mellitus type 2. #6.  Acute MI precipitated by septic shock. #7.  Elevated LFTs secondary to sepsis.   Midge Minium

## 2021-06-30 NOTE — Code Documentation (Signed)
Pt transferred to ICU

## 2021-06-30 NOTE — ED Notes (Signed)
Pt resting, still on bipap, no complaints at this time

## 2021-06-30 DEATH — deceased

## 2022-11-08 IMAGING — CT CT HEAD W/O CM
3 series · 14 of 46 positions shown, 16 images · non-contrast
Comparison: None.

CLINICAL DATA: Fall, hit head

EXAM:
CT HEAD WITHOUT CONTRAST
TECHNIQUE: Contiguous axial images were obtained from the base of the skull
through the vertex without intravenous contrast.

[Series 2: head wo · axial · 0.46mm/px · z∈[-154,-34]mm · 8 of 29 slices shown, 10 images]
[im 3/29  brain]
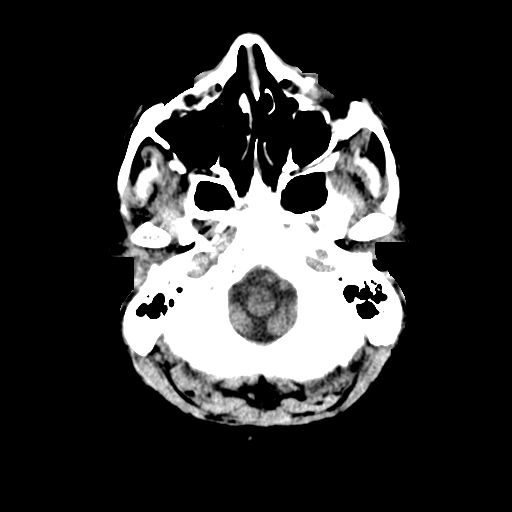
[im 3/29  bone]
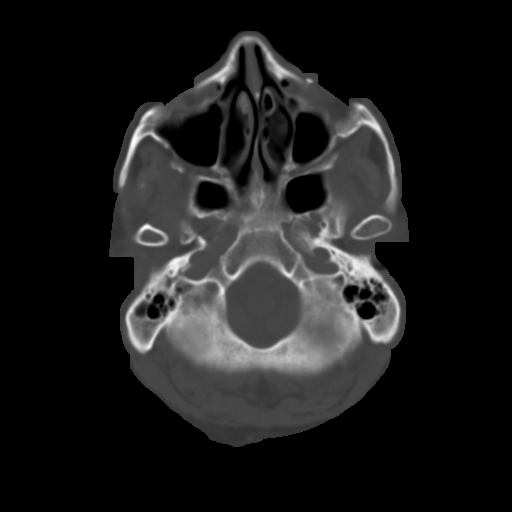
[im 7/29  brain]
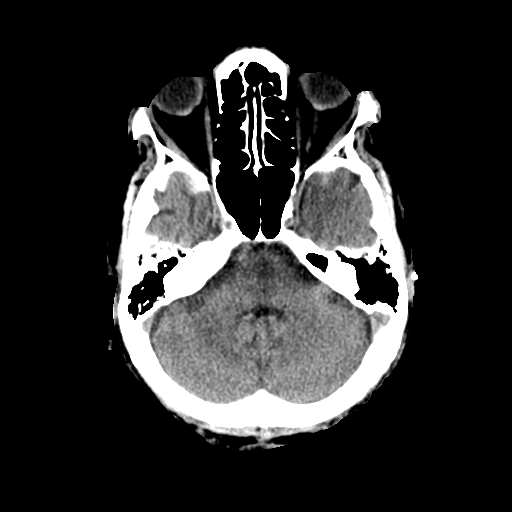
[im 10/29  brain]
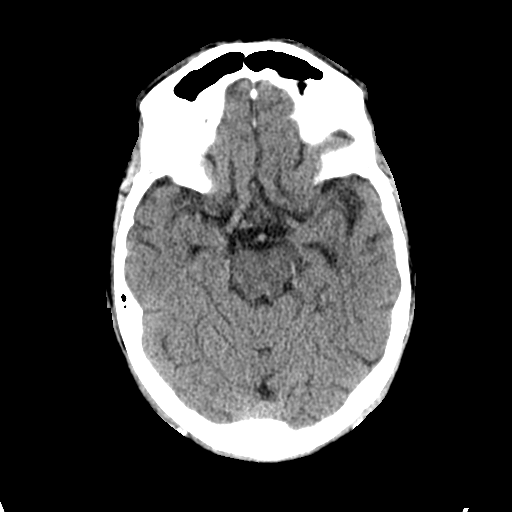
[im 13/29  brain]
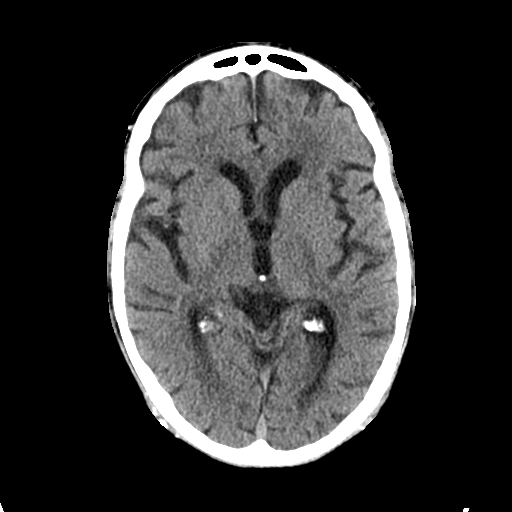
[im 17/29  brain]
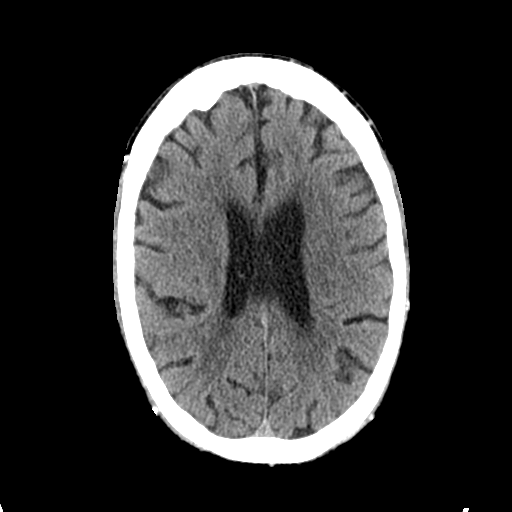
[im 17/29  bone]
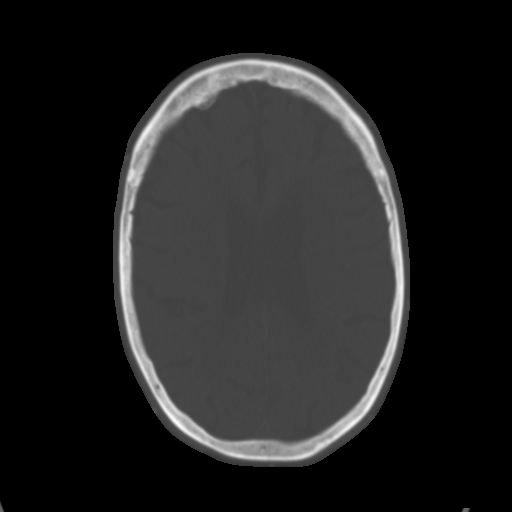
[im 20/29  brain]
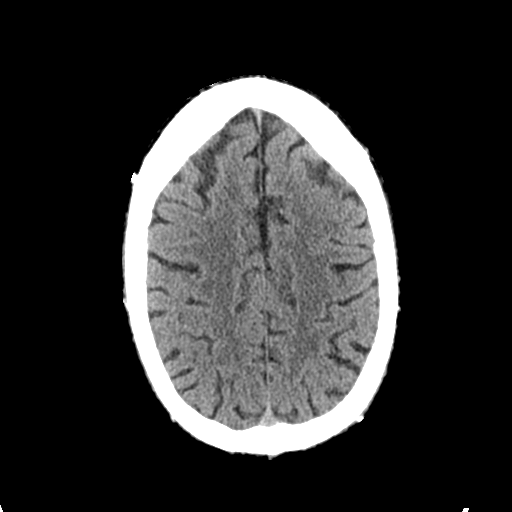
[im 23/29  brain]
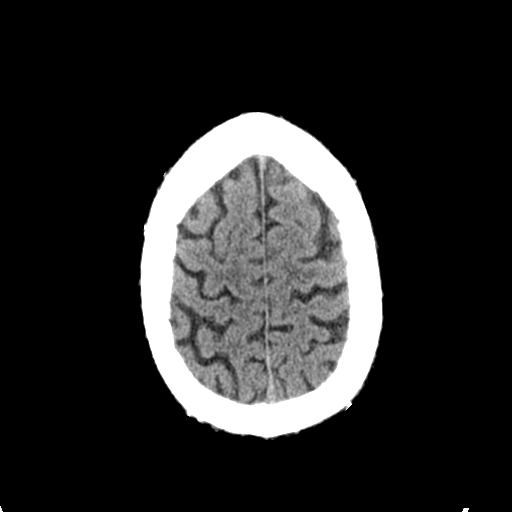
[im 27/29  brain]
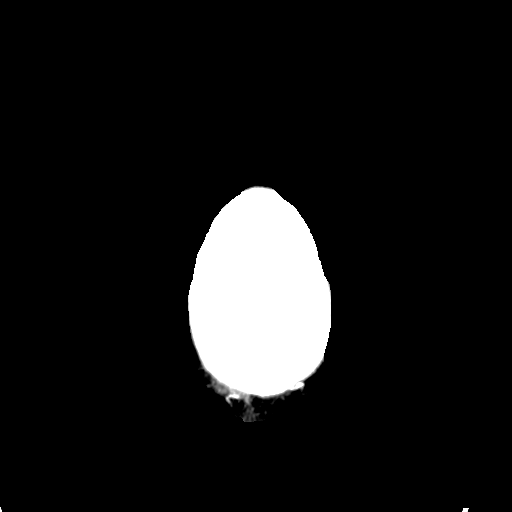

[Series 4: coronal soft tissue · coronal · 0.29mm/px · 3 of 64 slices shown]
[im 22/64  brain]
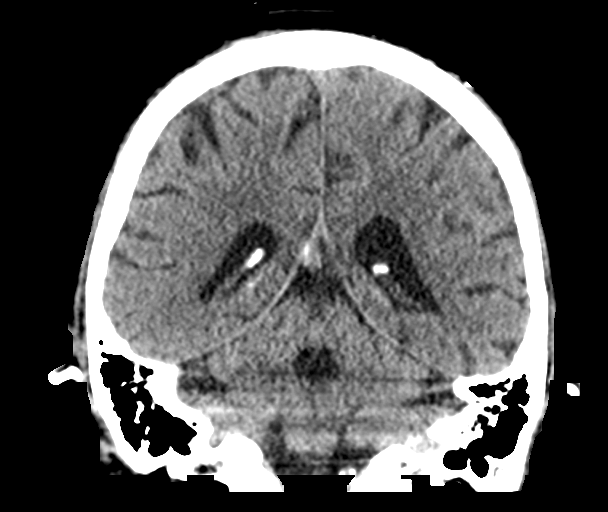
[im 29/64  brain]
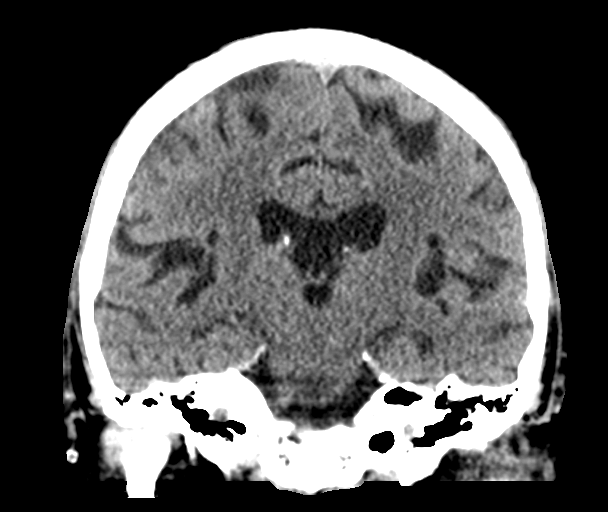
[im 36/64  brain]
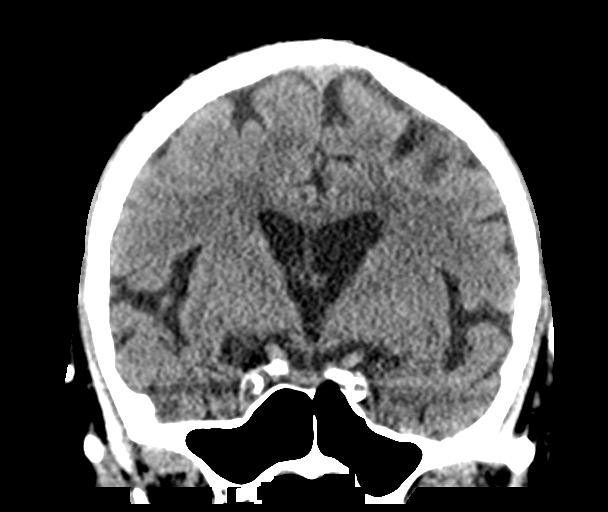

[Series 5: sagittal soft tissue · sagittal · 0.29mm/px · 3 of 52 slices shown]
[im 18/52  brain]
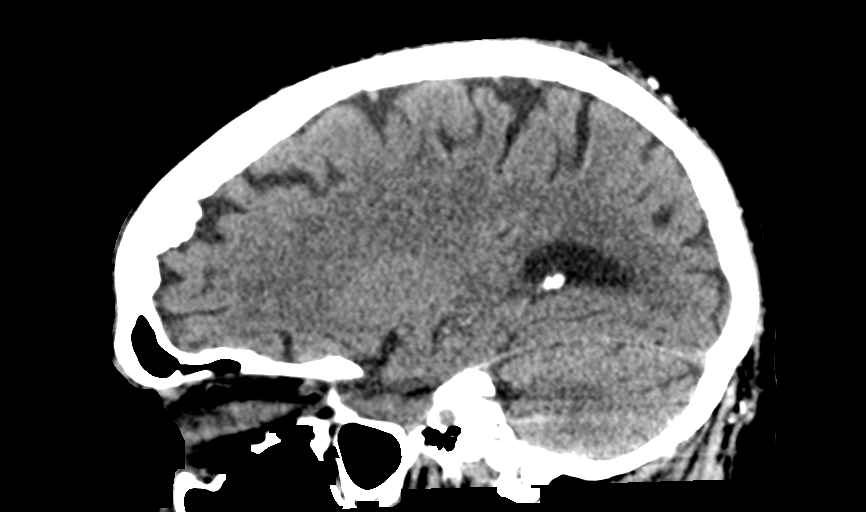
[im 26/52  brain]
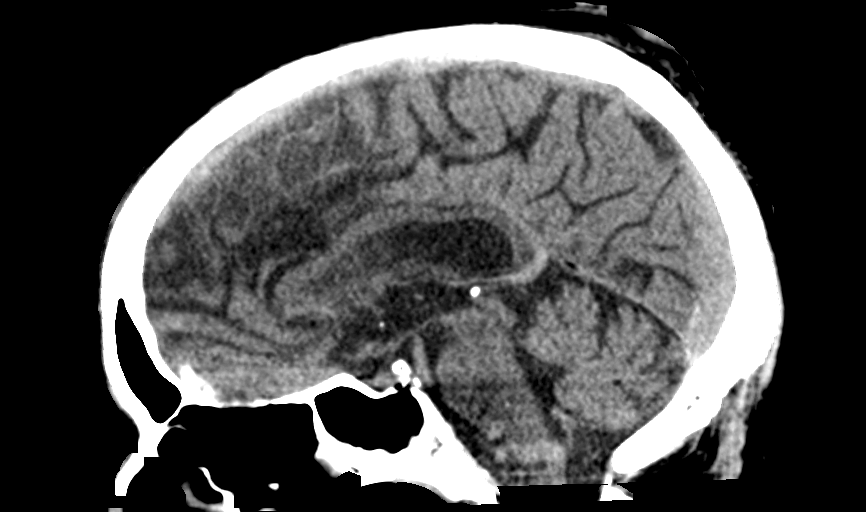
[im 35/52  brain]
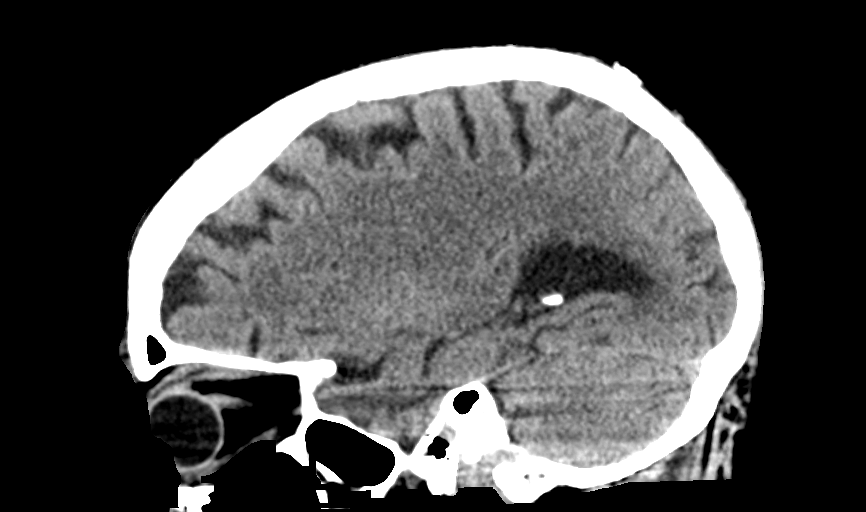

[14 of 46 positions shown; findings below may reference images not displayed]

FINDINGS: Brain: There is no acute intracranial hemorrhage, mass effect, or
edema. Gray-white differentiation is preserved. There is no
extra-axial fluid collection. Prominence of the ventricles and sulci
reflects mild generalized parenchymal volume loss. Patchy
hypoattenuation in the supratentorial white matter is nonspecific
but probably reflects mild to moderate chronic microvascular
ischemic changes.

Vascular: There is atherosclerotic calcification at the skull base.

Skull: Calvarium is unremarkable. Plate and screw fixation is
partially imaged along the left inferior orbital rim.

Sinuses/Orbits: Minor mucosal thickening. No acute orbital
abnormality.

Other: Soft tissue swelling/laceration at the vertex.
IMPRESSION: No evidence of acute intracranial injury.

## 2023-03-30 IMAGING — DX DG CHEST 1V PORT
1 series · 2 of 2 positions shown · non-contrast
Comparison: 06/01/2021 at [DATE] a.m.

CLINICAL DATA: Intubation

EXAM:
PORTABLE CHEST 1 VIEW

[Series 1: chest ap · 0.14mm/px · 2 of 2 slices shown]
[im 1/2]
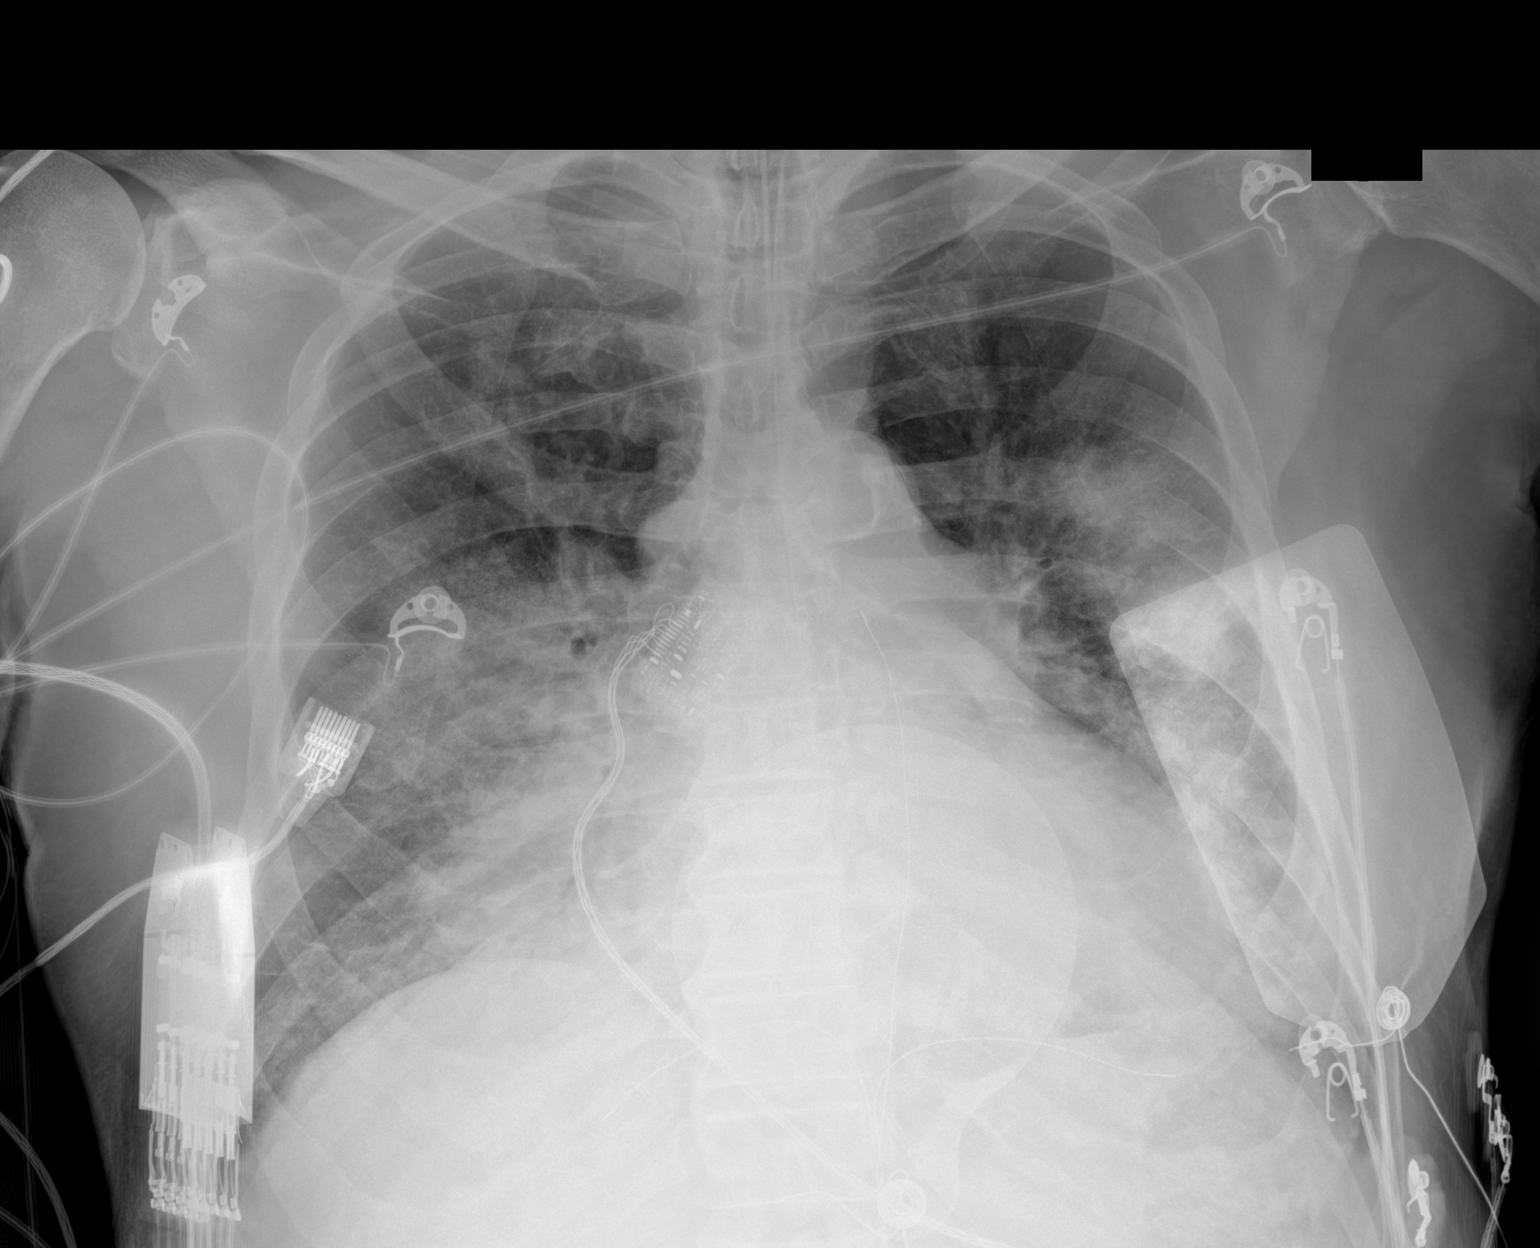
[im 2/2]
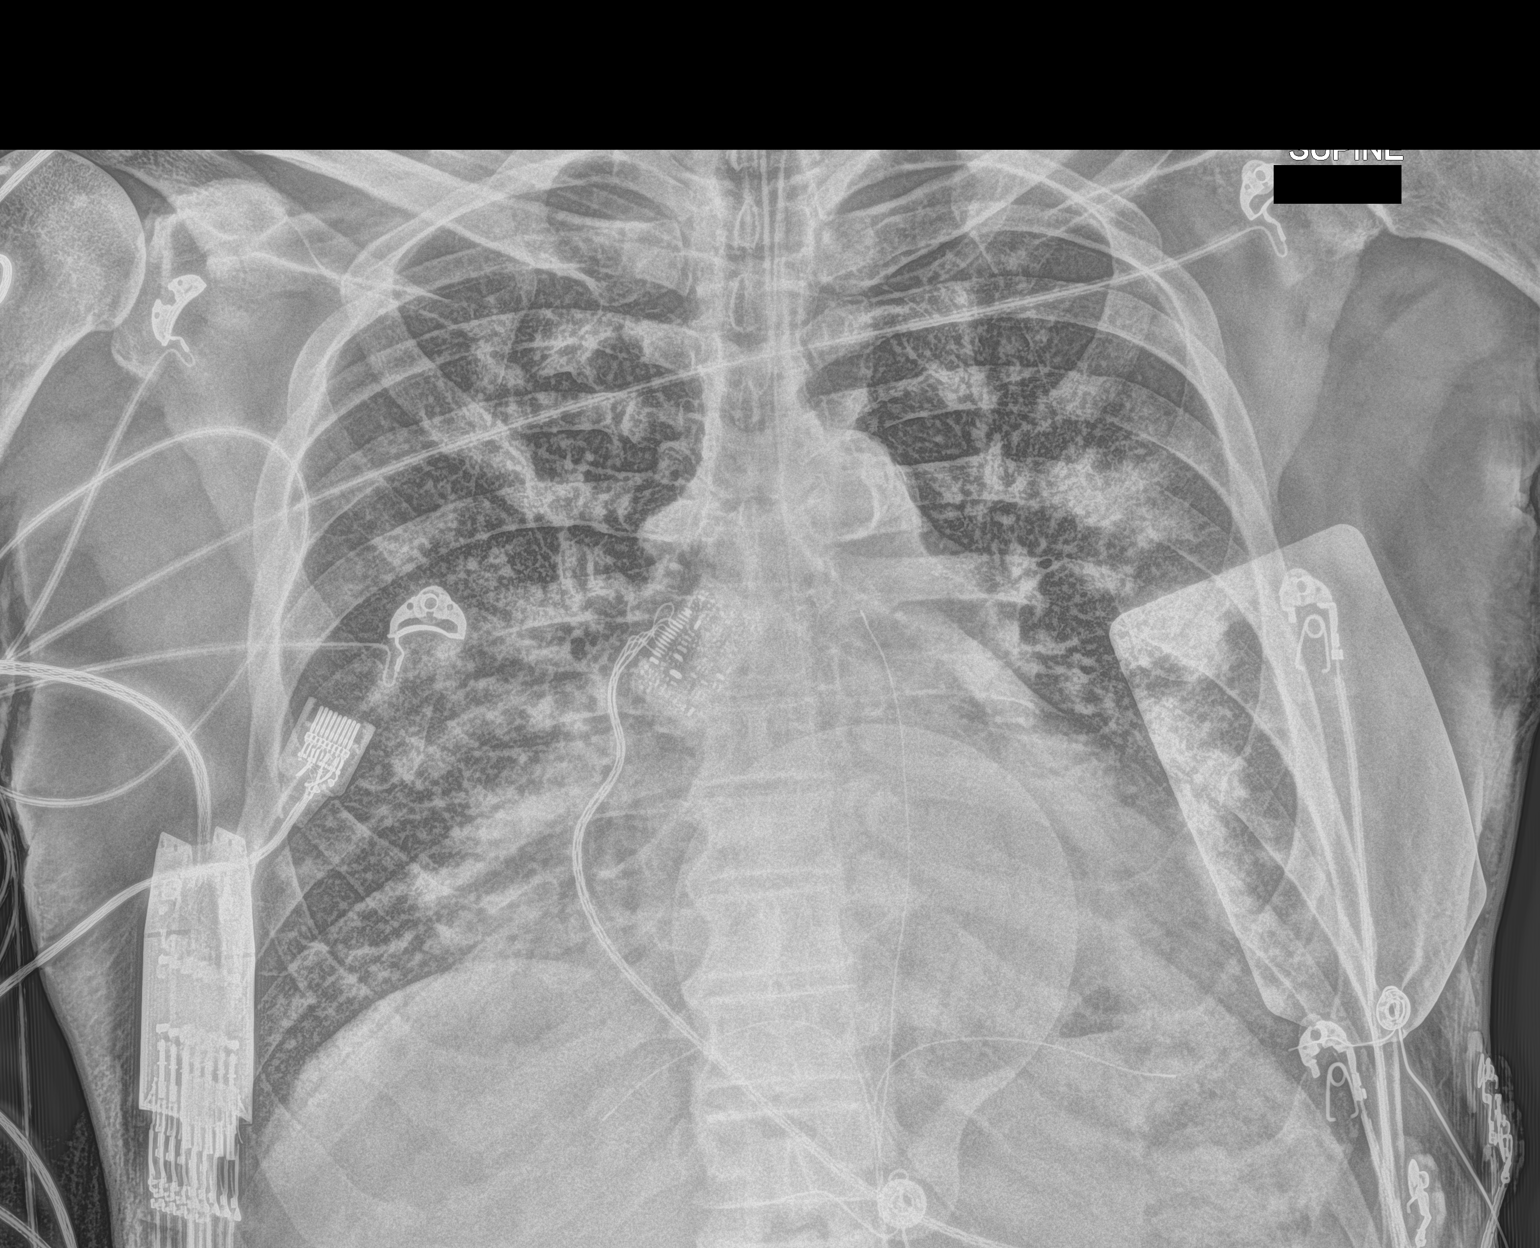

[2 of 2 positions shown; findings below may reference images not displayed]

FINDINGS: Support Apparatus:

--Endotracheal tube: Tip at the level of the clavicular heads.

--Enteric tube:Tip is in the thoracic esophagus, poorly visualized

--Vascular catheter(s):None

--Other: None

Patchy bilateral airspace opacities are unchanged.  No pneumothorax.
IMPRESSION: 1. Endotracheal tube tip at the level of the clavicular heads.
2. Poorly visualized nasogastric tube tip, likely in the thoracic
esophagus.
3. Unchanged patchy bilateral airspace opacities concerning for
infection.

## 2023-03-30 IMAGING — DX DG CHEST 1V PORT
1 series · 1 of 1 positions shown · non-contrast
Comparison: 05/31/2021

CLINICAL DATA: Dyspnea

EXAM:
PORTABLE CHEST 1 VIEW

[chest ap]
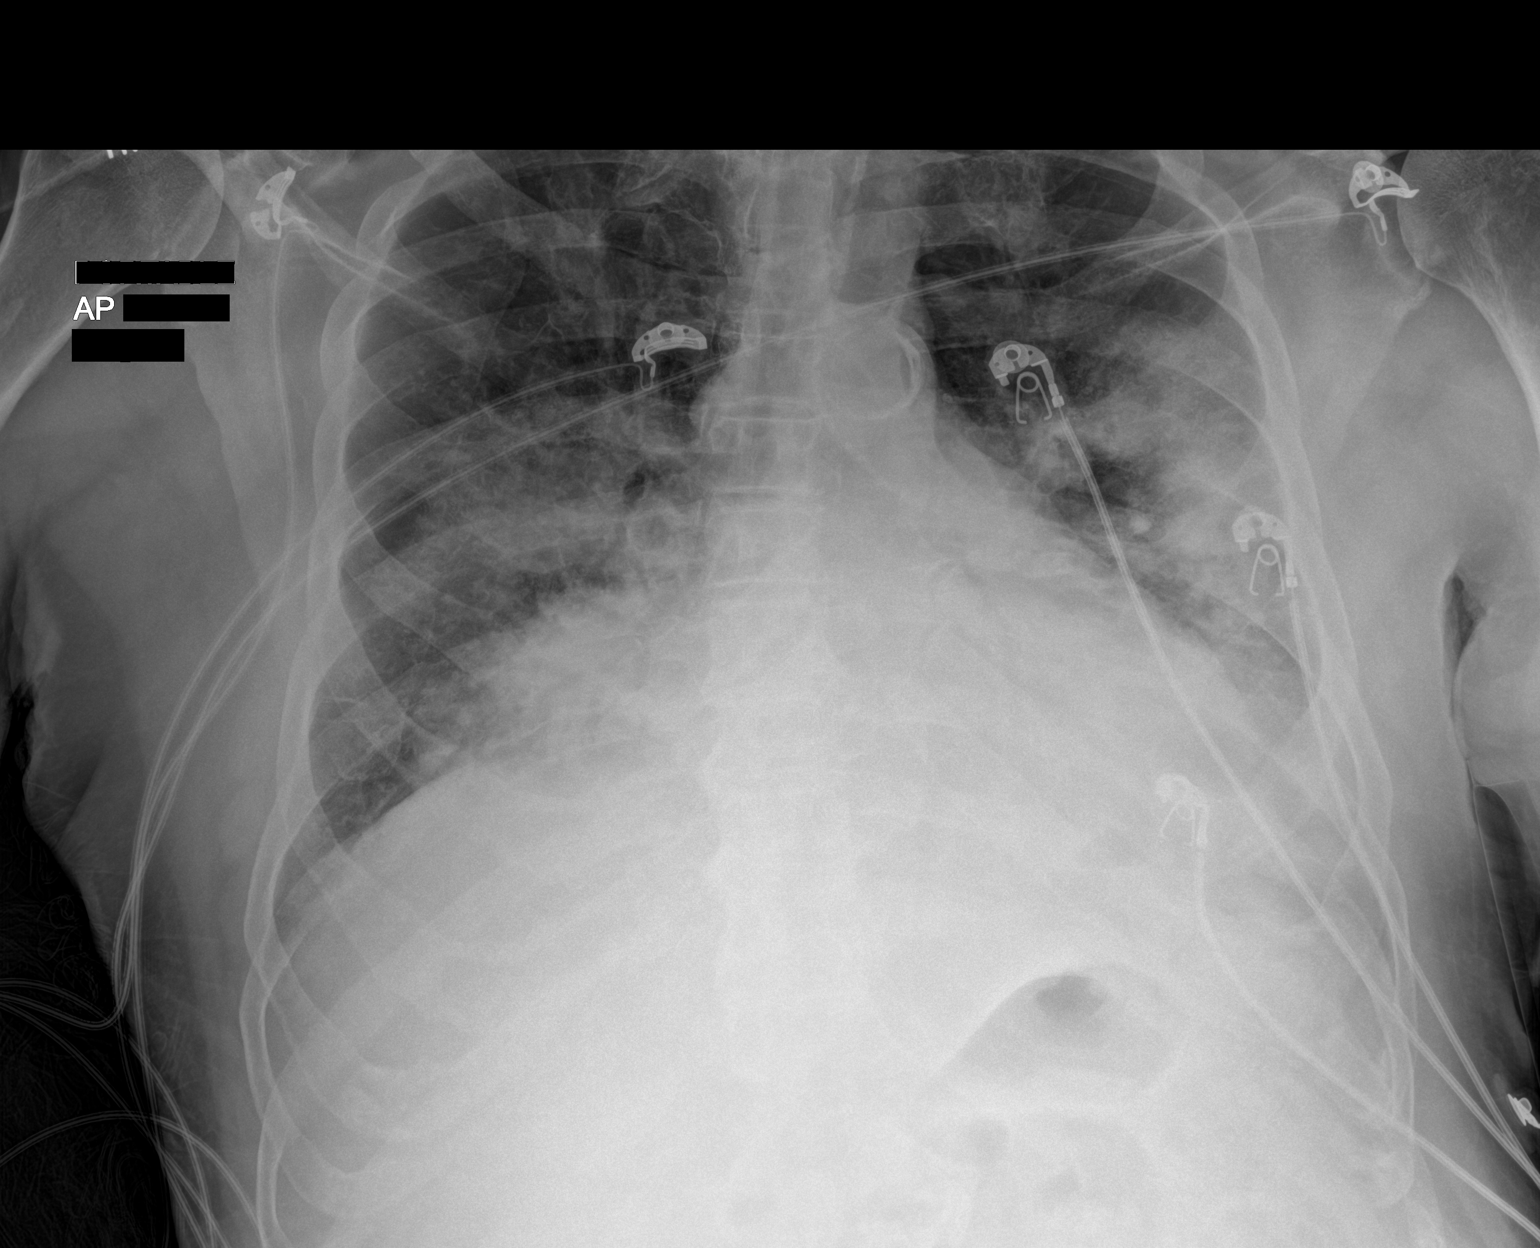

[1 of 1 positions shown; findings below may reference images not displayed]

FINDINGS: Pulmonary insufflation is stable. Multifocal pulmonary consolidation
within the mid and lower lung zones appears progressive since prior
examination, particularly within the mid lung zones. No
pneumothorax. Possible small left pleural effusion. Cardiac size is
within normal limits. Pulmonary vascularity is normal.
IMPRESSION: Progressive multifocal pulmonary infiltrate, likely infectious or
inflammatory. Small left pleural effusion suspected.
